# Patient Record
Sex: Female | Born: 1935 | Race: White | Hispanic: No | Marital: Married | Smoking: Former smoker
Health system: Southern US, Community
[De-identification: ages and names within clinical notes are randomized; demographics above are authoritative.]

## PROBLEM LIST (undated history)

## (undated) DIAGNOSIS — E785 Hyperlipidemia, unspecified: Secondary | ICD-10-CM

## (undated) DIAGNOSIS — N183 Chronic kidney disease, stage 3 unspecified: Secondary | ICD-10-CM

## (undated) DIAGNOSIS — M25551 Pain in right hip: Secondary | ICD-10-CM

## (undated) DIAGNOSIS — G8929 Other chronic pain: Secondary | ICD-10-CM

## (undated) DIAGNOSIS — D649 Anemia, unspecified: Secondary | ICD-10-CM

## (undated) DIAGNOSIS — I495 Sick sinus syndrome: Secondary | ICD-10-CM

## (undated) DIAGNOSIS — I10 Essential (primary) hypertension: Secondary | ICD-10-CM

## (undated) DIAGNOSIS — I4819 Other persistent atrial fibrillation: Secondary | ICD-10-CM

## (undated) DIAGNOSIS — M1990 Unspecified osteoarthritis, unspecified site: Secondary | ICD-10-CM

## (undated) HISTORY — DX: Other persistent atrial fibrillation: I48.19

## (undated) HISTORY — PX: APPENDECTOMY: SHX54

## (undated) HISTORY — DX: Essential (primary) hypertension: I10

## (undated) HISTORY — DX: Unspecified osteoarthritis, unspecified site: M19.90

## (undated) HISTORY — DX: Sick sinus syndrome: I49.5

## (undated) HISTORY — DX: Chronic kidney disease, stage 3 (moderate): N18.3

## (undated) HISTORY — DX: Chronic kidney disease, stage 3 unspecified: N18.30

## (undated) HISTORY — PX: HERNIA REPAIR: SHX51

## (undated) HISTORY — PX: CATARACT EXTRACTION W/ INTRAOCULAR LENS  IMPLANT, BILATERAL: SHX1307

## (undated) HISTORY — PX: UMBILICAL HERNIA REPAIR: SHX196

## (undated) HISTORY — DX: Hyperlipidemia, unspecified: E78.5

## (undated) HISTORY — PX: ABDOMINAL HYSTERECTOMY: SHX81

## (undated) HISTORY — PX: TONSILLECTOMY: SUR1361

## (undated) HISTORY — PX: DILATION AND CURETTAGE OF UTERUS: SHX78

---

## 2010-03-16 HISTORY — PX: CARDIOVERSION: SHX1299

## 2012-03-29 HISTORY — PX: PACEMAKER INSERTION: SHX728

## 2013-01-24 DIAGNOSIS — M2040 Other hammer toe(s) (acquired), unspecified foot: Secondary | ICD-10-CM | POA: Insufficient documentation

## 2013-01-24 DIAGNOSIS — M2010 Hallux valgus (acquired), unspecified foot: Secondary | ICD-10-CM | POA: Insufficient documentation

## 2013-01-24 DIAGNOSIS — B351 Tinea unguium: Secondary | ICD-10-CM | POA: Insufficient documentation

## 2013-01-24 DIAGNOSIS — M79673 Pain in unspecified foot: Secondary | ICD-10-CM | POA: Insufficient documentation

## 2013-01-24 DIAGNOSIS — L602 Onychogryphosis: Secondary | ICD-10-CM | POA: Insufficient documentation

## 2013-03-23 ENCOUNTER — Telehealth: Payer: Self-pay | Admitting: Cardiology

## 2013-03-23 NOTE — Telephone Encounter (Signed)
New problem:  Pt's grandson states in the past 5 days the pt has gained about 5 lbs. PT's grandson states he gave 80 mg of lasix on 10/6 and 10/7... Pt states that is a double dose for her. Pt states pt's legs are still swollen. Pt's grandson would like to be advised.

## 2013-03-23 NOTE — Telephone Encounter (Signed)
I would continue with the double dose of Lasix 80 mg for the next several days with close monitoring of salt restriction and fluid restriction to 1.5 L a day. Please keep [place] updated.

## 2013-03-24 ENCOUNTER — Ambulatory Visit (INDEPENDENT_AMBULATORY_CARE_PROVIDER_SITE_OTHER): Payer: Medicare Other | Admitting: Pharmacist

## 2013-03-24 DIAGNOSIS — I4891 Unspecified atrial fibrillation: Secondary | ICD-10-CM

## 2013-03-24 LAB — POCT INR: INR: 2.4

## 2013-03-24 NOTE — Telephone Encounter (Signed)
Spoke with patient and informed to continue the 80 mg  Of Lasix daily and to pay close attention to her salt restriction and fluid restriction to 1.5 L a day. If swelling continues to give [place] a call. Patient verbalized understanding.

## 2013-03-25 ENCOUNTER — Encounter: Payer: Self-pay | Admitting: Cardiology

## 2013-03-25 DIAGNOSIS — E785 Hyperlipidemia, unspecified: Secondary | ICD-10-CM

## 2013-03-25 DIAGNOSIS — I119 Hypertensive heart disease without heart failure: Secondary | ICD-10-CM | POA: Insufficient documentation

## 2013-03-25 DIAGNOSIS — N183 Chronic kidney disease, stage 3 unspecified: Secondary | ICD-10-CM

## 2013-03-25 DIAGNOSIS — Z7901 Long term (current) use of anticoagulants: Secondary | ICD-10-CM

## 2013-03-25 DIAGNOSIS — Z950 Presence of cardiac pacemaker: Secondary | ICD-10-CM

## 2013-03-25 DIAGNOSIS — I10 Essential (primary) hypertension: Secondary | ICD-10-CM

## 2013-03-25 DIAGNOSIS — M1990 Unspecified osteoarthritis, unspecified site: Secondary | ICD-10-CM

## 2013-03-30 ENCOUNTER — Encounter: Payer: Self-pay | Admitting: Cardiology

## 2013-03-30 ENCOUNTER — Ambulatory Visit (INDEPENDENT_AMBULATORY_CARE_PROVIDER_SITE_OTHER): Payer: Medicare Other | Admitting: Cardiology

## 2013-03-30 VITALS — BP 140/91 | HR 91 | Ht 67.0 in | Wt 184.0 lb

## 2013-03-30 DIAGNOSIS — E785 Hyperlipidemia, unspecified: Secondary | ICD-10-CM

## 2013-03-30 DIAGNOSIS — Z950 Presence of cardiac pacemaker: Secondary | ICD-10-CM

## 2013-03-30 DIAGNOSIS — N183 Chronic kidney disease, stage 3 unspecified: Secondary | ICD-10-CM

## 2013-03-30 DIAGNOSIS — Z7901 Long term (current) use of anticoagulants: Secondary | ICD-10-CM

## 2013-03-30 DIAGNOSIS — I4892 Unspecified atrial flutter: Secondary | ICD-10-CM

## 2013-03-30 DIAGNOSIS — I10 Essential (primary) hypertension: Secondary | ICD-10-CM

## 2013-03-30 DIAGNOSIS — Z01812 Encounter for preprocedural laboratory examination: Secondary | ICD-10-CM

## 2013-03-30 DIAGNOSIS — I4891 Unspecified atrial fibrillation: Secondary | ICD-10-CM

## 2013-03-30 LAB — CBC WITH DIFFERENTIAL/PLATELET
Basophils Relative: 0.3 % (ref 0.0–3.0)
Eosinophils Relative: 1.3 % (ref 0.0–5.0)
HCT: 43.4 % (ref 36.0–46.0)
Lymphs Abs: 1.4 10*3/uL (ref 0.7–4.0)
MCV: 91.2 fl (ref 78.0–100.0)
Monocytes Absolute: 0.6 10*3/uL (ref 0.1–1.0)
Platelets: 256 10*3/uL (ref 150.0–400.0)
RBC: 4.75 Mil/uL (ref 3.87–5.11)
RDW: 15.3 % — ABNORMAL HIGH (ref 11.5–14.6)
WBC: 8 10*3/uL (ref 4.5–10.5)

## 2013-03-30 LAB — BASIC METABOLIC PANEL
CO2: 33 mEq/L — ABNORMAL HIGH (ref 19–32)
Calcium: 9.4 mg/dL (ref 8.4–10.5)
Glucose, Bld: 86 mg/dL (ref 70–99)
Potassium: 4.1 mEq/L (ref 3.5–5.1)
Sodium: 142 mEq/L (ref 135–145)

## 2013-03-30 NOTE — Patient Instructions (Signed)
Your physician has recommended that you have a Cardioversion (DCCV). Electrical Cardioversion uses a jolt of electricity to your heart either through paddles or wired patches attached to your chest. This is a controlled, usually prescheduled, procedure. Defibrillation is done under light anesthesia in the hospital, and you usually go home the day of the procedure. This is done to get your heart back into a normal rhythm. You are not awake for the procedure. Please see the instruction sheet given to you today.  Your physician recommends that you continue on your current medications as directed. Please refer to the Current Medication list given to you today.  Your physician recommends that you schedule a follow-up appointment in: 2 weeks after cardioversion that scheduled for 04/04/2013 @ 12:00 am.

## 2013-03-30 NOTE — Progress Notes (Signed)
[address] [place], [place]  [address] Phone: [phone] Fax:  [phone]  Date:  03/30/2013   ID:  Cheryl [name], DOB [birth year], MRN [id number]  PCP:  Johny Blamer, MD   History of Present Illness: Cheryl [name] is a 79 y.o. female with atrial fibrillation/flutter on chronic anticoagulation, pacemaker, hypertension, hyperlipidemia, chronic amiodarone use here for followup.  She has a Medtronic pacemaker implanted in October of 2013. At a previous hospitalization in [place] where her potassium was low, acute renal failure secondary to metolazone and Lasix. She is relatively new to the area 8/14. Overall she is doing very well. Prior myocardial infarction, prior stroke. LDL 91 on 10/21/12. Hemoglobin 12.9. TSH normal. BNP 787 July of 2014. Prior cardioversion October of 2011. EKG previously had shown an ectopic atrial rhythm with rate of 55. Current EKG shows atrial flutter with variable AV block. After prior cardioversion went back into fib.   According to her recent pacemaker check, she has been in chronic atrial fibrillation/flutter since mid June of 2014. Prior to that, she seemed to be in paroxysmal atrial fibrillation. She has been on amiodarone for quite some time. She had a cardioversion after being on amiodarone in [place]. Afternoon nap, sometimes wakes up without warning and startled with gasping. Changes position and this helps. If takes a big breath feels better, goes away.  At times feels left shoulder/chest pain sharp, in chair, non exertional. Dull pain in shoulder and breast which eased up. Hand shakes. Tremor. Better now.   After a long day at church on Sundays she feels quite fatigued and it takes her a while to recover. Her weight had increased slightly. She had doubled up on her Lasix. She has not been doubling up on her potassium however. We will see what her lab work shows.   Wt Readings from Last 3 Encounters:  03/30/13 184  lb (83.462 kg)     Past Medical History  Diagnosis Date  . Atrial fibrillation   . Chronic anticoagulation   . Hyperlipidemia   . Hypertension   . Pacemaker   . CKD (chronic kidney disease), stage III   . Osteoarthritis     Past Surgical History  Procedure Laterality Date  . Tonsillectomy    . Abdominal hysterectomy    . Appendectomy      Current Outpatient Prescriptions  Medication Sig Dispense Refill  . acetaminophen (TYLENOL) 500 MG tablet Take 500 mg by mouth as needed for pain.      Marland Kitchen amiodarone (PACERONE) 200 MG tablet Take 200 mg by mouth 2 (two) times daily.      Marland Kitchen atorvastatin (LIPITOR) 20 MG tablet Take 20 mg by mouth daily.      . Calcium Carb-Cholecalciferol (CALCIUM 500 +D) 500-400 MG-UNIT TABS Take by mouth daily.      . Cholecalciferol 400 UNITS CHEW Chew by mouth daily.      . cyanocobalamin ([employer]S VITAMIN B12) 1000 MCG tablet Take 3,000 mcg by mouth daily.       . fish oil-omega-3 fatty acids 1000 MG capsule Take 1 g by mouth daily.      . furosemide (LASIX) 40 MG tablet Take 40 mg by mouth as needed.       . Magnesium 250 MG TABS Take 250 mg by mouth daily.      . Multiple Vitamins-Minerals (CENTRUM SILVER PO) Take by mouth daily.      . potassium chloride SA (K-DUR,KLOR-CON)  20 MEQ tablet Take 20 mEq by mouth daily.      . vitamin E (VITAMIN E) 400 UNIT capsule Take 400 Units by mouth daily.      Marland Kitchen warfarin (COUMADIN) 2.5 MG tablet Take 2.5 mg by mouth daily. 1 tablet on all days except 1/2 tablet on on fridays once daily       No current facility-administered medications for this visit.    Allergies:    Allergies  Allergen Reactions  . Demerol [Meperidine] Nausea And Vomiting    Social History:  The patient  reports that she quit smoking about 18 years ago. She does not have any smokeless tobacco history on file. She reports that she does not drink alcohol or use illicit drugs.   ROS:  Please see the history of present illness.  Increasing fatigue, no  strokelike symptoms, increasing shortness of breath at times. Increasing weight. No chest pain. No bleeding.   All other systems reviewed and negative.   PHYSICAL EXAM: VS:  BP 140/91  Pulse 91  Ht 5\' 7"  (1.702 m)  Wt 184 lb (83.462 kg)  BMI 28.81 kg/m2 Well nourished, well developed, in no acute distress HEENT: normal Neck: no JVD Cardiac:  Irregularly irregular, normal rate; 2/6 systolic murmur apex Lungs:  clear to auscultation bilaterally, no wheezing, rhonchi or rales Abd: soft, nontender, no hepatomegaly Ext: no edema, compression hose in place Skin: warm and dry Neuro: no focal abnormalities noted  EKG:  Atrial flutter and 91, positive deflection in V1, +3.     ASSESSMENT AND PLAN:  1. Atrial flutter-we discussed at length. We discussed pros and cons of continued rhythm versus rate control. We have decided since she is on amiodarone to once again try an attempt at cardioversion to see if this helps stabilize her rhythm. Close monitoring to symptoms. She may have slightly worse diastolic heart failure with atrial flutter pattern. We have increased her Lasix recently because of mild weight gain. Continue this for now. In the past she has had hypokalemia so we will be checking her lab work prior to cardioversion. If necessary, we will increase her potassium. If cardioversion is not successful or if she reverts back to atrial fibrillation/flutter and is not had any significant change in her symptoms, we will likely discontinue her amiodarone and place her on rate control therapy such as diltiazem or metoprolol. 2. Amiodarone use-continuing to monitor. 3. Chronic anticoagulation-therapeutic INR 2.4 last Thursday. 4. Pacemaker-prior report noted. Atrial flutter since June. 5. Weight gain-likely diastolic dysfunction/mild diastolic heart failure. May be exacerbated by atrial flutter. Continue with Lasix doubled dose as prescribed. Checking potassium. 6. We will see her back 2 weeks post  cardioversion.  Signed, Donato Schultz, MD [facility]  03/30/2013 10:22 AM

## 2013-04-04 ENCOUNTER — Encounter (HOSPITAL_COMMUNITY): Payer: Self-pay | Admitting: *Deleted

## 2013-04-04 ENCOUNTER — Encounter (HOSPITAL_COMMUNITY): Payer: Medicare Other | Admitting: Anesthesiology

## 2013-04-04 ENCOUNTER — Encounter (HOSPITAL_COMMUNITY)
Admission: RE | Disposition: A | Discharge status: Home / Self Care | Payer: Medicare Other | Source: Ambulatory Visit | Attending: Cardiology

## 2013-04-04 ENCOUNTER — Ambulatory Visit (HOSPITAL_COMMUNITY)
Admission: RE | Admit: 2013-04-04 | Discharge: 2013-04-04 | Disposition: A | Discharge status: Home / Self Care | Payer: Medicare Other | Source: Ambulatory Visit | Attending: Cardiology | Admitting: Cardiology

## 2013-04-04 ENCOUNTER — Ambulatory Visit (HOSPITAL_COMMUNITY): Payer: Medicare Other | Admitting: Anesthesiology

## 2013-04-04 DIAGNOSIS — I4892 Unspecified atrial flutter: Secondary | ICD-10-CM

## 2013-04-04 DIAGNOSIS — N183 Chronic kidney disease, stage 3 unspecified: Secondary | ICD-10-CM | POA: Insufficient documentation

## 2013-04-04 DIAGNOSIS — Z7901 Long term (current) use of anticoagulants: Secondary | ICD-10-CM

## 2013-04-04 DIAGNOSIS — E785 Hyperlipidemia, unspecified: Secondary | ICD-10-CM | POA: Insufficient documentation

## 2013-04-04 DIAGNOSIS — Z950 Presence of cardiac pacemaker: Secondary | ICD-10-CM | POA: Insufficient documentation

## 2013-04-04 DIAGNOSIS — I129 Hypertensive chronic kidney disease with stage 1 through stage 4 chronic kidney disease, or unspecified chronic kidney disease: Secondary | ICD-10-CM | POA: Insufficient documentation

## 2013-04-04 DIAGNOSIS — R635 Abnormal weight gain: Secondary | ICD-10-CM | POA: Insufficient documentation

## 2013-04-04 DIAGNOSIS — I4891 Unspecified atrial fibrillation: Secondary | ICD-10-CM | POA: Diagnosis present

## 2013-04-04 HISTORY — PX: CARDIOVERSION: SHX1299

## 2013-04-04 SURGERY — CARDIOVERSION
Anesthesia: General

## 2013-04-04 MED ORDER — PROPOFOL 10 MG/ML IV BOLUS
INTRAVENOUS | Status: DC | PRN
  Administered 2013-04-04: 70 mg via INTRAVENOUS

## 2013-04-04 NOTE — Anesthesia Preprocedure Evaluation (Addendum)
Anesthesia Evaluation  Patient identified by MRN, date of birth, ID band Patient awake    Reviewed: Allergy & Precautions, H&P , NPO status , Patient's Chart, lab work & pertinent test results  History of Anesthesia Complications Negative for: history of anesthetic complications  Airway Mallampati: II TM Distance: >3 FB Neck ROM: Full    Dental  (+) Teeth Intact and Dental Advisory Given   Pulmonary neg pulmonary ROS,    Pulmonary exam normal       Cardiovascular hypertension, + dysrhythmias Atrial Fibrillation Rhythm:Irregular Rate:Normal     Neuro/Psych negative neurological ROS  negative psych ROS   GI/Hepatic negative GI ROS, Neg liver ROS,   Endo/Other  negative endocrine ROS  Renal/GU Renal InsufficiencyRenal disease     Musculoskeletal   Abdominal   Peds  Hematology   Anesthesia Other Findings   Reproductive/Obstetrics                           Anesthesia Physical Anesthesia Plan  ASA: III  Anesthesia Plan: General   Post-op Pain Management:    Induction: Intravenous  Airway Management Planned:   Additional Equipment:   Intra-op Plan:   Post-operative Plan:   Informed Consent: I have reviewed the patients History and Physical, chart, labs and discussed the procedure including the risks, benefits and alternatives for the proposed anesthesia with the patient or authorized representative who has indicated his/her understanding and acceptance.   Dental advisory given  Plan Discussed with: CRNA, Anesthesiologist and Surgeon  Anesthesia Plan Comments:         Anesthesia Quick Evaluation

## 2013-04-04 NOTE — Anesthesia Postprocedure Evaluation (Signed)
Anesthesia Post Note  Patient: Cheryl [name]  Procedure(s) Performed: Procedure(s) (LRB): CARDIOVERSION (N/A)  Anesthesia type: general  Patient location: PACU  Post pain: Pain level controlled  Post assessment: Patient's Cardiovascular Status Stable  Last Vitals:  Filed Vitals:   04/04/13 1310  BP: 152/58  Pulse: 62  Temp:   Resp: 17    Post vital signs: Reviewed and stable  Level of consciousness: sedated  Complications: No apparent anesthesia complications

## 2013-04-04 NOTE — Interval H&P Note (Signed)
History and Physical Interval Note:  04/04/2013 12:40 PM  Cheryl [name]  has presented today for surgery, with the diagnosis of aflutter  The various methods of treatment have been discussed with the patient and family. After consideration of risks, benefits and other options for treatment, the patient has consented to  Procedure(s): CARDIOVERSION (N/A) as a surgical intervention .  The patient's history has been reviewed, patient examined, no change in status, stable for surgery.  I have reviewed the patient's chart and labs.  Questions were answered to the patient's satisfaction.     SKAINS, MARK

## 2013-04-04 NOTE — Transfer of Care (Signed)
Immediate Anesthesia Transfer of Care Note  Patient: Cheryl [name]  Procedure(s) Performed: Procedure(s): CARDIOVERSION (N/A)  Patient Location: PACU  Anesthesia Type:General  Level of Consciousness: awake, alert , oriented and sedated  Airway & Oxygen Therapy: Patient Spontanous Breathing and Patient connected to face mask oxygen  Post-op Assessment: Report given to PACU RN, Post -op Vital signs reviewed and stable and Patient moving all extremities  Post vital signs: Reviewed and stable  Complications: No apparent anesthesia complications

## 2013-04-04 NOTE — CV Procedure (Signed)
Electrical Cardioversion Procedure Note DESHUNDA THACKSTON [id number] [birth year]  Procedure: Electrical Cardioversion Indications:  Atrial Fibrillation  Time Out: Verified patient identification, verified procedure,medications/allergies/relevent history reviewed, required imaging and test results available.  Performed  Procedure Details  The patient was NPO after midnight. Anesthesia was administered at the beside  by Dr.Singer with 70mg  of propofol.  Cardioversion was performed with synchronized biphasic defibrillation via AP pads with 150, 200, 200 joules.  3 attempt(s) were performed.  The patient converted to normal sinus rhythm. The patient tolerated the procedure well   IMPRESSION:  Successful cardioversion of atrial fibrillation.     Kalyani Maeda 04/04/2013, 12:52 PM

## 2013-04-04 NOTE — H&P (View-Only) (Signed)
    [address] [place], [place]  [address] Phone: [phone] Fax:  [phone]  Date:  03/30/2013   ID:  Cheryl [name], DOB 01/29/1927, MRN [id number]  PCP:  HARRIS, WILLIAM, MD   History of Present Illness: Cheryl [name] is a 79 y.o. female with atrial fibrillation/flutter on chronic anticoagulation, pacemaker, hypertension, hyperlipidemia, chronic amiodarone use here for followup.  She has a Medtronic pacemaker implanted in October of 2013. At a previous hospitalization in [place] where her potassium was low, acute renal failure secondary to metolazone and Lasix. She is relatively new to the area 8/14. Overall she is doing very well. Prior myocardial infarction, prior stroke. LDL 91 on 10/21/12. Hemoglobin 12.9. TSH normal. BNP 787 July of 2014. Prior cardioversion October of 2011. EKG previously had shown an ectopic atrial rhythm with rate of 55. Current EKG shows atrial flutter with variable AV block. After prior cardioversion went back into fib.   According to her recent pacemaker check, she has been in chronic atrial fibrillation/flutter since mid June of 2014. Prior to that, she seemed to be in paroxysmal atrial fibrillation. She has been on amiodarone for quite some time. She had a cardioversion after being on amiodarone in [place]. Afternoon nap, sometimes wakes up without warning and startled with gasping. Changes position and this helps. If takes a big breath feels better, goes away.  At times feels left shoulder/chest pain sharp, in chair, non exertional. Dull pain in shoulder and breast which eased up. Hand shakes. Tremor. Better now.   After a long day at church on Sundays she feels quite fatigued and it takes her a while to recover. Her weight had increased slightly. She had doubled up on her Lasix. She has not been doubling up on her potassium however. We will see what her lab work shows.   Wt Readings from Last 3 Encounters:  03/30/13 184  lb (83.462 kg)     Past Medical History  Diagnosis Date  . Atrial fibrillation   . Chronic anticoagulation   . Hyperlipidemia   . Hypertension   . Pacemaker   . CKD (chronic kidney disease), stage III   . Osteoarthritis     Past Surgical History  Procedure Laterality Date  . Tonsillectomy    . Abdominal hysterectomy    . Appendectomy      Current Outpatient Prescriptions  Medication Sig Dispense Refill  . acetaminophen (TYLENOL) 500 MG tablet Take 500 mg by mouth as needed for pain.      . amiodarone (PACERONE) 200 MG tablet Take 200 mg by mouth 2 (two) times daily.      . atorvastatin (LIPITOR) 20 MG tablet Take 20 mg by mouth daily.      . Calcium Carb-Cholecalciferol (CALCIUM 500 +D) 500-400 MG-UNIT TABS Take by mouth daily.      . Cholecalciferol 400 UNITS CHEW Chew by mouth daily.      . cyanocobalamin (CVS VITAMIN B12) 1000 MCG tablet Take 3,000 mcg by mouth daily.       . fish oil-omega-3 fatty acids 1000 MG capsule Take 1 g by mouth daily.      . furosemide (LASIX) 40 MG tablet Take 40 mg by mouth as needed.       . Magnesium 250 MG TABS Take 250 mg by mouth daily.      . Multiple Vitamins-Minerals (CENTRUM SILVER PO) Take by mouth daily.      . potassium chloride SA (K-DUR,KLOR-CON)   20 MEQ tablet Take 20 mEq by mouth daily.      . vitamin E (VITAMIN E) 400 UNIT capsule Take 400 Units by mouth daily.      . warfarin (COUMADIN) 2.5 MG tablet Take 2.5 mg by mouth daily. 1 tablet on all days except 1/2 tablet on on fridays once daily       No current facility-administered medications for this visit.    Allergies:    Allergies  Allergen Reactions  . Demerol [Meperidine] Nausea And Vomiting    Social History:  The patient  reports that she quit smoking about 18 years ago. She does not have any smokeless tobacco history on file. She reports that she does not drink alcohol or use illicit drugs.   ROS:  Please see the history of present illness.  Increasing fatigue, no  strokelike symptoms, increasing shortness of breath at times. Increasing weight. No chest pain. No bleeding.   All other systems reviewed and negative.   PHYSICAL EXAM: VS:  BP 140/91  Pulse 91  Ht 5' 7" (1.702 m)  Wt 184 lb (83.462 kg)  BMI 28.81 kg/m2 Well nourished, well developed, in no acute distress HEENT: normal Neck: no JVD Cardiac:  Irregularly irregular, normal rate; 2/6 systolic murmur apex Lungs:  clear to auscultation bilaterally, no wheezing, rhonchi or rales Abd: soft, nontender, no hepatomegaly Ext: no edema, compression hose in place Skin: warm and dry Neuro: no focal abnormalities noted  EKG:  Atrial flutter and 91, positive deflection in V1, +3.     ASSESSMENT AND PLAN:  1. Atrial flutter-we discussed at length. We discussed pros and cons of continued rhythm versus rate control. We have decided since she is on amiodarone to once again try an attempt at cardioversion to see if this helps stabilize her rhythm. Close monitoring to symptoms. She may have slightly worse diastolic heart failure with atrial flutter pattern. We have increased her Lasix recently because of mild weight gain. Continue this for now. In the past she has had hypokalemia so we will be checking her lab work prior to cardioversion. If necessary, we will increase her potassium. If cardioversion is not successful or if she reverts back to atrial fibrillation/flutter and is not had any significant change in her symptoms, we will likely discontinue her amiodarone and place her on rate control therapy such as diltiazem or metoprolol. 2. Amiodarone use-continuing to monitor. 3. Chronic anticoagulation-therapeutic INR 2.4 last Thursday. 4. Pacemaker-prior report noted. Atrial flutter since June. 5. Weight gain-likely diastolic dysfunction/mild diastolic heart failure. May be exacerbated by atrial flutter. Continue with Lasix doubled dose as prescribed. Checking potassium. 6. We will see her back 2 weeks post  cardioversion.  Signed, Avis Tirone, MD FACC  03/30/2013 10:22 AM    

## 2013-04-05 ENCOUNTER — Encounter (HOSPITAL_COMMUNITY): Payer: Self-pay | Admitting: Cardiology

## 2013-04-06 ENCOUNTER — Telehealth: Payer: Self-pay | Admitting: Cardiology

## 2013-04-06 NOTE — Telephone Encounter (Signed)
New Problem    Please call gran daughter Cheryl [name] she has some observations she would like to speak to you about.  Pt is having SOB and Dizziness.

## 2013-04-07 NOTE — Telephone Encounter (Signed)
Spoke with patient grandson, he advised that she is no longer having the symptoms, told him to monitor her, should she have symptoms again to call to get her in for appointment, or if its unbearable to go to the ER. HE did state occassional chest discomfort, recent cardioversion ,explained that normal for about a 1-2 weeks.

## 2013-04-17 ENCOUNTER — Emergency Department (HOSPITAL_BASED_OUTPATIENT_CLINIC_OR_DEPARTMENT_OTHER): Payer: Medicare Other

## 2013-04-17 ENCOUNTER — Emergency Department (HOSPITAL_BASED_OUTPATIENT_CLINIC_OR_DEPARTMENT_OTHER)
Admission: EM | Admit: 2013-04-17 | Discharge: 2013-04-17 | Disposition: A | Discharge status: Home / Self Care | Payer: Medicare Other | Attending: Emergency Medicine | Admitting: Emergency Medicine

## 2013-04-17 ENCOUNTER — Encounter (HOSPITAL_BASED_OUTPATIENT_CLINIC_OR_DEPARTMENT_OTHER): Payer: Self-pay | Admitting: Emergency Medicine

## 2013-04-17 DIAGNOSIS — Z79899 Other long term (current) drug therapy: Secondary | ICD-10-CM | POA: Insufficient documentation

## 2013-04-17 DIAGNOSIS — X500XXA Overexertion from strenuous movement or load, initial encounter: Secondary | ICD-10-CM | POA: Insufficient documentation

## 2013-04-17 DIAGNOSIS — Z950 Presence of cardiac pacemaker: Secondary | ICD-10-CM | POA: Insufficient documentation

## 2013-04-17 DIAGNOSIS — R059 Cough, unspecified: Secondary | ICD-10-CM | POA: Insufficient documentation

## 2013-04-17 DIAGNOSIS — Z87891 Personal history of nicotine dependence: Secondary | ICD-10-CM | POA: Insufficient documentation

## 2013-04-17 DIAGNOSIS — N183 Chronic kidney disease, stage 3 unspecified: Secondary | ICD-10-CM | POA: Insufficient documentation

## 2013-04-17 DIAGNOSIS — M1990 Unspecified osteoarthritis, unspecified site: Secondary | ICD-10-CM | POA: Insufficient documentation

## 2013-04-17 DIAGNOSIS — I4891 Unspecified atrial fibrillation: Secondary | ICD-10-CM | POA: Insufficient documentation

## 2013-04-17 DIAGNOSIS — R05 Cough: Secondary | ICD-10-CM | POA: Insufficient documentation

## 2013-04-17 DIAGNOSIS — Z7901 Long term (current) use of anticoagulants: Secondary | ICD-10-CM | POA: Insufficient documentation

## 2013-04-17 DIAGNOSIS — S93402A Sprain of unspecified ligament of left ankle, initial encounter: Secondary | ICD-10-CM

## 2013-04-17 DIAGNOSIS — S8000XA Contusion of unspecified knee, initial encounter: Secondary | ICD-10-CM | POA: Insufficient documentation

## 2013-04-17 DIAGNOSIS — R5381 Other malaise: Secondary | ICD-10-CM | POA: Insufficient documentation

## 2013-04-17 DIAGNOSIS — S93409A Sprain of unspecified ligament of unspecified ankle, initial encounter: Secondary | ICD-10-CM | POA: Insufficient documentation

## 2013-04-17 DIAGNOSIS — W010XXA Fall on same level from slipping, tripping and stumbling without subsequent striking against object, initial encounter: Secondary | ICD-10-CM | POA: Insufficient documentation

## 2013-04-17 DIAGNOSIS — E785 Hyperlipidemia, unspecified: Secondary | ICD-10-CM | POA: Insufficient documentation

## 2013-04-17 DIAGNOSIS — Y9389 Activity, other specified: Secondary | ICD-10-CM | POA: Insufficient documentation

## 2013-04-17 DIAGNOSIS — S8002XA Contusion of left knee, initial encounter: Secondary | ICD-10-CM

## 2013-04-17 DIAGNOSIS — Y929 Unspecified place or not applicable: Secondary | ICD-10-CM | POA: Insufficient documentation

## 2013-04-17 DIAGNOSIS — I129 Hypertensive chronic kidney disease with stage 1 through stage 4 chronic kidney disease, or unspecified chronic kidney disease: Secondary | ICD-10-CM | POA: Insufficient documentation

## 2013-04-17 LAB — URINALYSIS, ROUTINE W REFLEX MICROSCOPIC
Bilirubin Urine: NEGATIVE
Glucose, UA: NEGATIVE mg/dL
Hgb urine dipstick: NEGATIVE
Ketones, ur: NEGATIVE mg/dL
Nitrite: NEGATIVE
pH: 5.5 (ref 5.0–8.0)

## 2013-04-17 LAB — COMPREHENSIVE METABOLIC PANEL
BUN: 25 mg/dL — ABNORMAL HIGH (ref 6–23)
CO2: 28 mEq/L (ref 19–32)
Calcium: 9.9 mg/dL (ref 8.4–10.5)
Chloride: 102 mEq/L (ref 96–112)
Creatinine, Ser: 1.4 mg/dL — ABNORMAL HIGH (ref 0.50–1.10)
GFR calc non Af Amer: 33 mL/min — ABNORMAL LOW (ref >90)
Sodium: 140 mEq/L (ref 135–145)
Total Bilirubin: 0.8 mg/dL (ref 0.3–1.2)

## 2013-04-17 LAB — CBC WITH DIFFERENTIAL/PLATELET
Basophils Relative: 0 % (ref 0–1)
Eosinophils Absolute: 0 10*3/uL (ref 0.0–0.7)
Eosinophils Relative: 0 % (ref 0–5)
HCT: 40.5 % (ref 36.0–46.0)
Hemoglobin: 13.4 g/dL (ref 12.0–15.0)
MCH: 30.2 pg (ref 26.0–34.0)
MCHC: 33.1 g/dL (ref 30.0–36.0)
MCV: 91.4 fL (ref 78.0–100.0)
Monocytes Absolute: 1 10*3/uL (ref 0.1–1.0)
Monocytes Relative: 6 % (ref 3–12)
Neutro Abs: 13.4 10*3/uL — ABNORMAL HIGH (ref 1.7–7.7)
Platelets: 281 10*3/uL (ref 150–400)

## 2013-04-17 LAB — URINE MICROSCOPIC-ADD ON

## 2013-04-17 LAB — PROTIME-INR
INR: 2.28 — ABNORMAL HIGH (ref 0.00–1.49)
Prothrombin Time: 24.4 seconds — ABNORMAL HIGH (ref 11.6–15.2)

## 2013-04-17 NOTE — ED Provider Notes (Signed)
CSN: [id number]     Arrival date & time 04/17/13  2006 History   First MD Initiated Contact with Patient 04/17/13 2038     Chief Complaint  Patient presents with  . Ankle Pain   (Consider location/radiation/quality/duration/timing/severity/associated sxs/prior Treatment) Patient is a 79 y.o. female presenting with weakness. The history is provided by the patient and a relative. No language interpreter was used.  Weakness This is a new problem. The current episode started today. The problem occurs constantly. The problem has been unchanged. Associated symptoms include coughing, joint swelling, myalgias and weakness. Nothing aggravates the symptoms. She has tried nothing for the symptoms. The treatment provided moderate relief.  Pt had trouble getting off the toliet today.   Pt fell on the floor and turned her ankle.    Past Medical History  Diagnosis Date  . Atrial fibrillation     Prior cardioversion 10/11  . Chronic anticoagulation   . Hyperlipidemia   . Hypertension   . Pacemaker     Medtronic-10/13 implantation  . CKD (chronic kidney disease), stage III   . Osteoarthritis    Past Surgical History  Procedure Laterality Date  . Tonsillectomy    . Abdominal hysterectomy    . Appendectomy    . Cardioversion N/A 04/04/2013    Procedure: CARDIOVERSION;  Surgeon: Donato Schultz, MD;  Location: [facility] ENDOSCOPY;  Service: Cardiovascular;  Laterality: N/A;   Family History  Problem Relation Age of Onset  . Stroke Mother   . Heart Problems Father   . Hypertension      family history  . Diabetes      family history   History  Substance Use Topics  . Smoking status: Former Smoker    Quit date: 06/15/1994  . Smokeless tobacco: Not on file  . Alcohol Use: No   OB History   Grav Para Term Preterm Abortions TAB SAB Ect Mult Living                 Review of Systems  Respiratory: Positive for cough.   Musculoskeletal: Positive for joint swelling and myalgias.  Neurological:  Positive for weakness.  All other systems reviewed and are negative.    Allergies  Demerol  Home Medications   Current Outpatient Rx  Name  Route  Sig  Dispense  Refill  . acetaminophen (TYLENOL) 500 MG tablet   Oral   Take 500 mg by mouth as needed for pain.         Marland Kitchen amiodarone (PACERONE) 200 MG tablet   Oral   Take 200 mg by mouth 2 (two) times daily.         Marland Kitchen atorvastatin (LIPITOR) 20 MG tablet   Oral   Take 20 mg by mouth daily.         . Calcium Carb-Cholecalciferol (CALCIUM 500 +D) 500-400 MG-UNIT TABS   Oral   Take by mouth daily.         . Cholecalciferol 400 UNITS CHEW   Oral   Chew by mouth daily.         . cyanocobalamin (CVS VITAMIN B12) 1000 MCG tablet   Oral   Take 3,000 mcg by mouth daily.          . fish oil-omega-3 fatty acids 1000 MG capsule   Oral   Take 1 g by mouth daily.         . furosemide (LASIX) 40 MG tablet   Oral   Take 40 mg by mouth as  needed.          . Magnesium 250 MG TABS   Oral   Take 250 mg by mouth daily.         . Multiple Vitamins-Minerals (CENTRUM SILVER PO)   Oral   Take by mouth daily.         . potassium chloride SA (K-DUR,KLOR-CON) 20 MEQ tablet   Oral   Take 20 mEq by mouth daily.         . vitamin E (VITAMIN E) 400 UNIT capsule   Oral   Take 400 Units by mouth daily.         Marland Kitchen warfarin (COUMADIN) 2.5 MG tablet   Oral   Take 2.5 mg by mouth daily. 1 tablet on all days except 1/2 tablet on on fridays once daily          BP 159/60  Pulse 71  Temp(Src) 99.1 F (37.3 C) (Oral)  Resp 16  SpO2 95% Physical Exam  Nursing note and vitals reviewed. Constitutional: She is oriented to person, place, and time. She appears well-developed and well-nourished.  HENT:  Head: Normocephalic.  Right Ear: External ear normal.  Left Ear: External ear normal.  Nose: Nose normal.  Mouth/Throat: Oropharynx is clear and moist.  Eyes: Conjunctivae and EOM are normal. Pupils are equal, round,  and reactive to light.  Neck: Normal range of motion. Neck supple.  Cardiovascular: Normal rate and regular rhythm.   Pulmonary/Chest: Effort normal and breath sounds normal.  Abdominal: Soft.  Musculoskeletal: She exhibits edema and tenderness.  Neurological: She is alert and oriented to person, place, and time.  Skin: Skin is warm.  Psychiatric: She has a normal mood and affect.    ED Course  Procedures (including critical care time) Labs Review Labs Reviewed - No data to display Imaging Review Dg Chest 2 View  04/17/2013   CLINICAL DATA:  Recent traumatic injury with pain  EXAM: CHEST  2 VIEW  COMPARISON:  None.  FINDINGS: The cardiac shadow is mildly enlarged. A pacing device is seen. There is a rounded density identified in the mid to lower right lung. This may simply represent a nipple shadow. No focal infiltrate is seen.  IMPRESSION: Questionable nodular density on the right. Repeat imaging with nipple markers is recommended to rule out an underlying nodule.   Electronically Signed   By: Alcide Clever M.D.   On: 04/17/2013 21:09   Dg Ankle Complete Left  04/17/2013   CLINICAL DATA:  Traumatic injury with pain  EXAM: LEFT ANKLE COMPLETE - 3+ VIEW  COMPARISON:  None.  FINDINGS: No acute fractures noted. Degenerative changes in the tarsal bones are noted.  IMPRESSION: No acute abnormality seen.   Electronically Signed   By: Alcide Clever M.D.   On: 04/17/2013 21:08   Dg Knee Complete 4 Views Left  04/17/2013   CLINICAL DATA:  Knee pain following injury  EXAM: LEFT KNEE - COMPLETE 4+ VIEW  COMPARISON:  None.  FINDINGS: There is no evidence of fracture, dislocation, or joint effusion. There is no evidence of arthropathy or other focal bone abnormality. Soft tissues show diffuse calcification in the popliteal artery. .  IMPRESSION: No acute abnormality noted.   Electronically Signed   By: Alcide Clever M.D.   On: 04/17/2013 21:09    EKG Interpretation     Ventricular Rate:  72 PR  Interval:  248 QRS Duration: 78 QT Interval:  416 QTC Calculation: 455 R Axis:  33 Text Interpretation:  Sinus rhythm with 1st degree A-V block Septal infarct , age undetermined Abnormal ECG PR interval shorter compared to prior ekg of  04/04/13           Results for orders placed during the hospital encounter of 04/17/13  URINALYSIS, ROUTINE W REFLEX MICROSCOPIC      Result Value Range   Color, Urine YELLOW  YELLOW   APPearance CLEAR  CLEAR   Specific Gravity, Urine 1.020  1.005 - 1.030   pH 5.5  5.0 - 8.0   Glucose, UA NEGATIVE  NEGATIVE mg/dL   Hgb urine dipstick NEGATIVE  NEGATIVE   Bilirubin Urine NEGATIVE  NEGATIVE   Ketones, ur NEGATIVE  NEGATIVE mg/dL   Protein, ur 30 (*) NEGATIVE mg/dL   Urobilinogen, UA 0.2  0.0 - 1.0 mg/dL   Nitrite NEGATIVE  NEGATIVE   Leukocytes, UA SMALL (*) NEGATIVE  CBC WITH DIFFERENTIAL      Result Value Range   WBC 15.3 (*) 4.0 - 10.5 K/uL   RBC 4.43  3.87 - 5.11 MIL/uL   Hemoglobin 13.4  12.0 - 15.0 g/dL   HCT 40.9  81.1 - 91.4 %   MCV 91.4  78.0 - 100.0 fL   MCH 30.2  26.0 - 34.0 pg   MCHC 33.1  30.0 - 36.0 g/dL   RDW 78.2  95.6 - 21.3 %   Platelets 281  150 - 400 K/uL   Neutrophils Relative % 88 (*) 43 - 77 %   Neutro Abs 13.4 (*) 1.7 - 7.7 K/uL   Lymphocytes Relative 6 (*) 12 - 46 %   Lymphs Abs 0.8  0.7 - 4.0 K/uL   Monocytes Relative 6  3 - 12 %   Monocytes Absolute 1.0  0.1 - 1.0 K/uL   Eosinophils Relative 0  0 - 5 %   Eosinophils Absolute 0.0  0.0 - 0.7 K/uL   Basophils Relative 0  0 - 1 %   Basophils Absolute 0.0  0.0 - 0.1 K/uL  COMPREHENSIVE METABOLIC PANEL      Result Value Range   Sodium 140  135 - 145 mEq/L   Potassium 4.0  3.5 - 5.1 mEq/L   Chloride 102  96 - 112 mEq/L   CO2 28  19 - 32 mEq/L   Glucose, Bld 118 (*) 70 - 99 mg/dL   BUN 25 (*) 6 - 23 mg/dL   Creatinine, Ser 0.86 (*) 0.50 - 1.10 mg/dL   Calcium 9.9  8.4 - 57.8 mg/dL   Total Protein 7.8  6.0 - 8.3 g/dL   Albumin 3.3 (*) 3.5 - 5.2 g/dL   AST  26  0 - 37 U/L   ALT 19  0 - 35 U/L   Alkaline Phosphatase 121 (*) 39 - 117 U/L   Total Bilirubin 0.8  0.3 - 1.2 mg/dL   GFR calc non Af Amer 33 (*) >90 mL/min   GFR calc Af Amer 39 (*) >90 mL/min  PROTIME-INR      Result Value Range   Prothrombin Time 24.4 (*) 11.6 - 15.2 seconds   INR 2.28 (*) 0.00 - 1.49  URINE MICROSCOPIC-ADD ON      Result Value Range   Squamous Epithelial / LPF FEW (*) RARE   WBC, UA 3-6  <3 WBC/hpf   RBC / HPF 0-2  <3 RBC/hpf   Bacteria, UA FEW (*) RARE   Casts HYALINE CASTS (*) NEGATIVE   Dg Chest 2 View  04/17/2013   CLINICAL DATA:  Ankle pain. Followup evaluation for possible nodular opacity. Nipple markers in place on this examination.  EXAM: CHEST  2 VIEW  COMPARISON:  CHEST x-ray 04/17/2013.  FINDINGS: There is cephalization of the pulmonary vasculature and slight indistinctness of the interstitial markings suggestive of mild pulmonary edema. Mild cardiomegaly No definite consolidative airspace disease. Previously suspected nodular opacity in the right mid lung corresponds to prominent 5th costochondral junction anteriorly. No definite nodular opacity identified on today's study. Trace right pleural effusion. Upper mediastinal contours are within normal limits. Atherosclerosis in the thoracic aorta. Left-sided pacemaker device in place with lead tips projecting over the expected location of the right atrium and right ventricular apex.  IMPRESSION: 1. No suspicious pulmonary nodule identified. 2. The appearance the chest suggests mild congestive heart failure, as above. 3. Atherosclerosis.   Electronically Signed   By: Trudie Reed M.D.   On: 04/17/2013 21:50   Dg Chest 2 View  04/17/2013   CLINICAL DATA:  Recent traumatic injury with pain  EXAM: CHEST  2 VIEW  COMPARISON:  None.  FINDINGS: The cardiac shadow is mildly enlarged. A pacing device is seen. There is a rounded density identified in the mid to lower right lung. This may simply represent a nipple  shadow. No focal infiltrate is seen.  IMPRESSION: Questionable nodular density on the right. Repeat imaging with nipple markers is recommended to rule out an underlying nodule.   Electronically Signed   By: Alcide Clever M.D.   On: 04/17/2013 21:09   Dg Ankle Complete Left  04/17/2013   CLINICAL DATA:  Traumatic injury with pain  EXAM: LEFT ANKLE COMPLETE - 3+ VIEW  COMPARISON:  None.  FINDINGS: No acute fractures noted. Degenerative changes in the tarsal bones are noted.  IMPRESSION: No acute abnormality seen.   Electronically Signed   By: Alcide Clever M.D.   On: 04/17/2013 21:08   Dg Knee Complete 4 Views Left  04/17/2013   CLINICAL DATA:  Knee pain following injury  EXAM: LEFT KNEE - COMPLETE 4+ VIEW  COMPARISON:  None.  FINDINGS: There is no evidence of fracture, dislocation, or joint effusion. There is no evidence of arthropathy or other focal bone abnormality. Soft tissues show diffuse calcification in the popliteal artery. .  IMPRESSION: No acute abnormality noted.   Electronically Signed   By: Alcide Clever M.D.   On: 04/17/2013 21:09   Dr. Karma Ganja in to see and examine pt.    Family (RN Vincenza Hews)   Advised monitor pt's temp.   We suspect elevated wbc's due to viral uri  MDM   1. Ankle sprain, left, initial encounter   2. Contusion of left knee, initial encounter        Elson Areas, PA-C 04/17/13 2342

## 2013-04-17 NOTE — ED Provider Notes (Signed)
Medical screening examination/treatment/procedure(s) were conducted as a shared visit with non-physician practitioner(s) and myself.  I personally evaluated the patient during the encounter.  EKG Interpretation     Ventricular Rate:  72 PR Interval:  248 QRS Duration: 78 QT Interval:  416 QTC Calculation: 455 R Axis:   33 Text Interpretation:  Sinus rhythm with 1st degree A-V block Septal infarct , age undetermined Abnormal ECG PR interval shorter compared to prior ekg of  04/04/13           PT seen and evaluated.  Pt is overall nontoxic and well hydrated in appearance.  Discussed all results with patient and grandson at bedside.  She has had multiple family members with viral URI and she has similar symptoms.  CXR and ua reassuring- urine culture pending.  Pt advised to have WBC rechecked in the next 2-3 days with her PMD and to monitor fever and respiratory status.  Discharged with strict return precautions.  Pt agreeable with plan.  Ethelda Chick, MD 04/17/13 [phone]

## 2013-04-17 NOTE — ED Notes (Addendum)
Pt was trying to sit in chair and slipped and fell on floor, twisting left ankle, c/o cold and congestions for several, denies SOB, no pain in ankle when sitting, cannot walk, swelling noted to ankle

## 2013-04-18 ENCOUNTER — Telehealth: Payer: Self-pay | Admitting: Cardiology

## 2013-04-18 ENCOUNTER — Ambulatory Visit: Payer: PRIVATE HEALTH INSURANCE | Admitting: Cardiology

## 2013-04-18 NOTE — Telephone Encounter (Signed)
New problem     Pt's grand daughter called pt fell and was seen at [facility].   She has a question about pt med. AMIODRONE. Should they renew this.

## 2013-04-19 LAB — URINE CULTURE: Colony Count: 25000

## 2013-04-22 ENCOUNTER — Telehealth: Payer: Self-pay | Admitting: *Deleted

## 2013-04-22 NOTE — Telephone Encounter (Signed)
Patient stated that her Furosemide was changed to bid and she needs a new rx sent to [facility] on [address].

## 2013-04-25 MED ORDER — FUROSEMIDE 40 MG PO TABS: 40.0000 mg | ORAL_TABLET | Freq: Two times a day (BID) | ORAL | Status: DC

## 2013-04-25 NOTE — Telephone Encounter (Signed)
Spoke with patients grandson and he advised that the patient was taking  40 mg QD and an additional tablet as needed. Per last visit patient stated that Dr. Anne Fu advised to take 40 mg twice a day. New Rx sent to pharmacy.Marland KitchenMarland Kitchen

## 2013-05-10 ENCOUNTER — Ambulatory Visit (INDEPENDENT_AMBULATORY_CARE_PROVIDER_SITE_OTHER): Payer: Medicare Other | Admitting: General Practice

## 2013-05-10 DIAGNOSIS — I4891 Unspecified atrial fibrillation: Secondary | ICD-10-CM

## 2013-05-23 ENCOUNTER — Ambulatory Visit (INDEPENDENT_AMBULATORY_CARE_PROVIDER_SITE_OTHER): Payer: Medicare Other | Admitting: *Deleted

## 2013-05-23 DIAGNOSIS — I4892 Unspecified atrial flutter: Secondary | ICD-10-CM

## 2013-05-23 DIAGNOSIS — I4891 Unspecified atrial fibrillation: Secondary | ICD-10-CM

## 2013-06-02 ENCOUNTER — Ambulatory Visit (INDEPENDENT_AMBULATORY_CARE_PROVIDER_SITE_OTHER): Payer: Medicare Other | Admitting: Cardiology

## 2013-06-02 ENCOUNTER — Encounter: Payer: Self-pay | Admitting: Cardiology

## 2013-06-02 ENCOUNTER — Ambulatory Visit: Payer: Self-pay | Admitting: Cardiology

## 2013-06-02 ENCOUNTER — Ambulatory Visit (INDEPENDENT_AMBULATORY_CARE_PROVIDER_SITE_OTHER): Payer: Medicare Other | Admitting: Pharmacist

## 2013-06-02 VITALS — BP 140/70 | HR 67 | Ht 67.0 in | Wt 176.0 lb

## 2013-06-02 DIAGNOSIS — I4891 Unspecified atrial fibrillation: Secondary | ICD-10-CM

## 2013-06-02 DIAGNOSIS — I10 Essential (primary) hypertension: Secondary | ICD-10-CM

## 2013-06-02 DIAGNOSIS — Z7901 Long term (current) use of anticoagulants: Secondary | ICD-10-CM

## 2013-06-02 DIAGNOSIS — Z950 Presence of cardiac pacemaker: Secondary | ICD-10-CM

## 2013-06-02 DIAGNOSIS — E785 Hyperlipidemia, unspecified: Secondary | ICD-10-CM

## 2013-06-02 MED ORDER — AMIODARONE HCL 200 MG PO TABS: 200.0000 mg | ORAL_TABLET | Freq: Every day | ORAL | Status: DC

## 2013-06-02 NOTE — Patient Instructions (Signed)
Your physician wants you to follow-up in: 6 MONTHS with DR. Skains You will receive a reminder letter in the mail two months in advance. If you don't receive a letter, please call our office to schedule the follow-up appointment.  Your physician has recommended you make the following change in your medication:  1. Decrease Amiodarone to 200 mg once daily

## 2013-06-02 NOTE — Progress Notes (Signed)
[address] [place], [place]  [address] Phone: [phone] Fax:  [phone]  Date:  06/02/2013   ID:  Cheryl [name], DOB 1926/07/09, MRN [id number]  PCP:  Johny Blamer, MD   History of Present Illness: Cheryl [name] is a 79 y.o. female with atrial fibrillation/flutter on chronic anticoagulation, pacemaker, hypertension, hyperlipidemia, chronic amiodarone use here for followup.  She has a Medtronic pacemaker implanted in October of 2013. At a previous hospitalization in [place] where her potassium was low, acute renal failure secondary to metolazone and Lasix. She is relatively new to the area 8/14. Overall she is doing very well. Prior myocardial infarction, prior stroke. LDL 91 on 10/21/12. Hemoglobin 12.9. TSH normal. BNP 787 July of 2014. Prior cardioversion October of 2011. EKG previously had shown an ectopic atrial rhythm with rate of 55. Current EKG shows atrial flutter with variable AV block. After prior cardioversion went back into fib.   According to her recent pacemaker check, she has been in chronic atrial fibrillation/flutter since mid June of 2014. Prior to that, she seemed to be in paroxysmal atrial fibrillation. She has been on amiodarone for quite some time. She had a cardioversion after being on amiodarone in [place]. Afternoon nap, sometimes wakes up without warning and startled with gasping. Changes position and this helps. If takes a big breath feels better, goes away.  Unfortunately she had a fall. Right knee effusion. Overall feels well without any palpitations.  Wt Readings from Last 3 Encounters:  06/02/13 176 lb (79.833 kg)  03/30/13 184 lb (83.462 kg)     Past Medical History  Diagnosis Date  . Atrial fibrillation     Prior cardioversion 10/11  . Chronic anticoagulation   . Hyperlipidemia   . Hypertension   . Pacemaker     Medtronic-10/13 implantation  . CKD (chronic kidney disease), stage III   . Osteoarthritis       Past Surgical History  Procedure Laterality Date  . Tonsillectomy    . Abdominal hysterectomy    . Appendectomy    . Cardioversion N/A 04/04/2013    Procedure: CARDIOVERSION;  Surgeon: Donato Schultz, MD;  Location: [facility] ENDOSCOPY;  Service: Cardiovascular;  Laterality: N/A;    Current Outpatient Prescriptions  Medication Sig Dispense Refill  . acetaminophen (TYLENOL) 500 MG tablet Take 500 mg by mouth as needed for pain.      Marland Kitchen amiodarone (PACERONE) 200 MG tablet Take 200 mg by mouth 2 (two) times daily.      Marland Kitchen atorvastatin (LIPITOR) 20 MG tablet Take 20 mg by mouth daily.      . Calcium Carb-Cholecalciferol (CALCIUM 500 +D) 500-400 MG-UNIT TABS Take by mouth daily.      . Cholecalciferol 400 UNITS CHEW Chew by mouth daily.      . cyanocobalamin (CVS VITAMIN B12) 1000 MCG tablet Take 3,000 mcg by mouth daily.       . fish oil-omega-3 fatty acids 1000 MG capsule Take 1 g by mouth daily.      . furosemide (LASIX) 40 MG tablet Take 1 tablet (40 mg total) by mouth 2 (two) times daily.  60 tablet  4  . Magnesium 250 MG TABS Take 250 mg by mouth daily.      . Multiple Vitamins-Minerals (CENTRUM SILVER PO) Take by mouth daily.      . potassium chloride SA (K-DUR,KLOR-CON) 20 MEQ tablet Take 20 mEq by mouth daily.      . traMADol (  ULTRAM) 50 MG tablet       . vitamin E (VITAMIN E) 400 UNIT capsule Take 400 Units by mouth daily.      Marland Kitchen warfarin (COUMADIN) 2.5 MG tablet Take 2.5 mg by mouth daily. 1 tablet on all days except 1/2 tablet on on fridays once daily       No current facility-administered medications for this visit.    Allergies:    Allergies  Allergen Reactions  . Demerol [Meperidine] Nausea And Vomiting    Social History:  The patient  reports that she quit smoking about 18 years ago. She does not have any smokeless tobacco history on file. She reports that she does not drink alcohol or use illicit drugs.   ROS:  Please see the history of present illness.   No CP, no SOB. Had  fall. Right knee swelling.   PHYSICAL EXAM: VS:  BP 140/70  Pulse 67  Ht 5\' 7"  (1.702 m)  Wt 176 lb (79.833 kg)  BMI 27.56 kg/m2 Well nourished, well developed, in no acute distress HEENT: normal Neck: no JVD Cardiac:  normal S1, S2; RRR; no murmur Lungs:  clear to auscultation bilaterally, no wheezing, rhonchi or rales Abd: soft, nontender, no hepatomegaly Ext: no edemaright knee effusion Skin: warm and dry Neuro: no focal abnormalities noted  EKG:  None today. Prior cardioversion.    ASSESSMENT AND PLAN:  1. Atrial flutter-s/p cardioversion. Amiodarone, will decrease to 200mg  once a day.    2. Chronic anticoagulation-Seeing Riki Rusk today.   3. Pacemaker-prior report noted. Atrial flutter since June.   4. Weight gain-likely diastolic dysfunction/mild diastolic heart failure. May be exacerbated by atrial flutter. Continue with Lasix doubled dose as prescribed.   Signed, Donato Schultz, MD [facility]  06/02/2013 2:20 PM

## 2013-06-03 LAB — MDC_IDC_ENUM_SESS_TYPE_REMOTE
Brady Statistic AP VS Percent: 23.78 %
Brady Statistic AS VP Percent: 3.1 %
Brady Statistic AS VS Percent: 65.81 %
Brady Statistic RV Percent Paced: 10.41 %
Lead Channel Impedance Value: 528 Ohm
Lead Channel Sensing Intrinsic Amplitude: 0.2608
Lead Channel Setting Pacing Amplitude: 2 V
Lead Channel Setting Pacing Amplitude: 3 V
Lead Channel Setting Pacing Pulse Width: 0.5 ms
Lead Channel Setting Sensing Sensitivity: 2.1 mV
Zone Setting Detection Interval: 400 ms
Zone Setting Detection Interval: 400 ms

## 2013-06-17 ENCOUNTER — Encounter: Payer: Self-pay | Admitting: *Deleted

## 2013-06-23 ENCOUNTER — Ambulatory Visit (INDEPENDENT_AMBULATORY_CARE_PROVIDER_SITE_OTHER): Payer: Medicare Other | Admitting: Pharmacist

## 2013-06-23 DIAGNOSIS — I4891 Unspecified atrial fibrillation: Secondary | ICD-10-CM

## 2013-06-23 LAB — POCT INR: INR: 2.4

## 2013-07-01 ENCOUNTER — Encounter: Payer: Self-pay | Admitting: Internal Medicine

## 2013-07-22 ENCOUNTER — Ambulatory Visit (INDEPENDENT_AMBULATORY_CARE_PROVIDER_SITE_OTHER): Payer: Medicare Other | Admitting: Pharmacist

## 2013-07-22 DIAGNOSIS — I4891 Unspecified atrial fibrillation: Secondary | ICD-10-CM

## 2013-07-22 LAB — POCT INR: INR: 4.4

## 2013-08-05 ENCOUNTER — Ambulatory Visit (INDEPENDENT_AMBULATORY_CARE_PROVIDER_SITE_OTHER): Payer: Medicare Other | Admitting: Pharmacist

## 2013-08-05 DIAGNOSIS — I4891 Unspecified atrial fibrillation: Secondary | ICD-10-CM

## 2013-08-05 LAB — POCT INR: INR: 2.3

## 2013-08-26 ENCOUNTER — Ambulatory Visit (INDEPENDENT_AMBULATORY_CARE_PROVIDER_SITE_OTHER): Payer: Medicare Other | Admitting: Pharmacist

## 2013-08-26 DIAGNOSIS — I4891 Unspecified atrial fibrillation: Secondary | ICD-10-CM

## 2013-08-26 LAB — POCT INR: INR: 2

## 2013-09-02 ENCOUNTER — Encounter (HOSPITAL_COMMUNITY): Payer: Self-pay | Admitting: Pharmacy Technician

## 2013-09-12 ENCOUNTER — Other Ambulatory Visit: Payer: Self-pay | Admitting: Orthopedic Surgery

## 2013-09-12 ENCOUNTER — Ambulatory Visit (INDEPENDENT_AMBULATORY_CARE_PROVIDER_SITE_OTHER): Payer: Medicare Other | Admitting: Pharmacist

## 2013-09-12 ENCOUNTER — Encounter (HOSPITAL_COMMUNITY)
Admission: RE | Admit: 2013-09-12 | Discharge: 2013-09-12 | Disposition: A | Discharge status: Home / Self Care | Payer: Medicare Other | Source: Ambulatory Visit | Attending: Orthopedic Surgery | Admitting: Orthopedic Surgery

## 2013-09-12 ENCOUNTER — Encounter (HOSPITAL_COMMUNITY): Payer: Self-pay

## 2013-09-12 ENCOUNTER — Telehealth: Payer: Self-pay | Admitting: Pharmacist

## 2013-09-12 ENCOUNTER — Ambulatory Visit (HOSPITAL_COMMUNITY)
Admission: RE | Admit: 2013-09-12 | Discharge: 2013-09-12 | Disposition: A | Discharge status: Home / Self Care | Payer: Medicare Other | Source: Ambulatory Visit | Attending: Anesthesiology | Admitting: Anesthesiology

## 2013-09-12 DIAGNOSIS — Z01812 Encounter for preprocedural laboratory examination: Secondary | ICD-10-CM | POA: Insufficient documentation

## 2013-09-12 DIAGNOSIS — I4891 Unspecified atrial fibrillation: Secondary | ICD-10-CM

## 2013-09-12 DIAGNOSIS — Z01818 Encounter for other preprocedural examination: Secondary | ICD-10-CM | POA: Insufficient documentation

## 2013-09-12 DIAGNOSIS — Z01811 Encounter for preprocedural respiratory examination: Secondary | ICD-10-CM

## 2013-09-12 HISTORY — DX: Anemia, unspecified: D64.9

## 2013-09-12 LAB — BASIC METABOLIC PANEL
BUN: 22 mg/dL (ref 6–23)
CO2: 28 meq/L (ref 19–32)
Calcium: 9.3 mg/dL (ref 8.4–10.5)
Chloride: 102 mEq/L (ref 96–112)
Creatinine, Ser: 1.01 mg/dL (ref 0.50–1.10)
GFR calc Af Amer: 57 mL/min — ABNORMAL LOW (ref >90)
GFR calc non Af Amer: 49 mL/min — ABNORMAL LOW (ref >90)
GLUCOSE: 101 mg/dL — AB (ref 70–99)
Potassium: 3.9 mEq/L (ref 3.7–5.3)
Sodium: 143 mEq/L (ref 137–147)

## 2013-09-12 LAB — POCT INR: INR: 2

## 2013-09-12 LAB — CBC
HCT: 43.1 % (ref 36.0–46.0)
Hemoglobin: 14.2 g/dL (ref 12.0–15.0)
MCH: 31.4 pg (ref 26.0–34.0)
MCHC: 32.9 g/dL (ref 30.0–36.0)
MCV: 95.4 fL (ref 78.0–100.0)
PLATELETS: 286 10*3/uL (ref 150–400)
RBC: 4.52 MIL/uL (ref 3.87–5.11)
RDW: 14.6 % (ref 11.5–15.5)
WBC: 8.4 10*3/uL (ref 4.0–10.5)

## 2013-09-12 LAB — PROTIME-INR
INR: 2.05 — ABNORMAL HIGH (ref 0.00–1.49)
Prothrombin Time: 22.5 seconds — ABNORMAL HIGH (ref 11.6–15.2)

## 2013-09-12 LAB — APTT: aPTT: 30 seconds (ref 24–37)

## 2013-09-12 LAB — TYPE AND SCREEN
ABO/RH(D): B POS
Antibody Screen: NEGATIVE

## 2013-09-12 LAB — SURGICAL PCR SCREEN
MRSA, PCR: NEGATIVE
Staphylococcus aureus: NEGATIVE

## 2013-09-12 LAB — ABO/RH: ABO/RH(D): B POS

## 2013-09-12 MED ORDER — ENOXAPARIN SODIUM 80 MG/0.8ML ~~LOC~~ SOLN: 80.0000 mg | Freq: Two times a day (BID) | SUBCUTANEOUS | Status: DC

## 2013-09-12 NOTE — Pre-Procedure Instructions (Signed)
Cheryl [name]  09/12/2013  Your procedure is scheduled on:  09-19-2013    Monday   [facility]  2 * 3 at 5:30 AM.   Call this number if you have problems the morning of surgery: [phone]   Remember:   Do not eat food or drink liquids after midnight.    Take these medicines the morning of surgery with A SIP OF WATER: aminodarone,eye gtts,pain medication as needed   Do not wear jewelry, make-up or nail polish .  Do not wear lotions, powders, or perfumes.   Do not shave 48 hours prior to surgery.   Do not bring valuables to the hospital.  [facility] is not responsible for any belongings or valuables.               Contacts, dentures or bridgework may not be worn into surgery.   Leave suitcase in the car. After surgery it may be brought to your room.  For patients admitted to the hospital, discharge time is determined by your treatment team.          Please read over the following fact sheets that you were given: Pain Booklet, Coughing and Deep Breathing, Blood Transfusion Information and Surgical Site Infection Prevention

## 2013-09-12 NOTE — Patient Instructions (Addendum)
You will have to hold your coumadin and use Lovenox (enoxaparin) 5 days prior to your procedure.   Wednesday 4/01 No Coumadin, No Lovenox Thursday 4/02 No Coumadin, Lovenox 80mg  under the skin in the AM, Lovenox 80mg  under the skin in the PM Friday   4/03 No Coumadin, Lovenox 80mg  under the skin in the AM, Lovenox 80mg  under the skin in the PM Saturday  4/04 No Coumadin, Lovenox 80mg  under the skin in the AM, Lovenox 80mg  under the skin in the PM Sunday  4/05 No Coumadin, Lovenox 80mg  under the skin in the AM, No Lovenox in the PM  Please follow-up with surgery team for resuming of Coumadin.

## 2013-09-12 NOTE — Telephone Encounter (Signed)
Spoke with Dr. Anne FuSkains today regarding patient's upcoming total knee replacement by Dr. Turner Danielsowan.  Patient has h/o AFib and CVA and treated with Coumadin.  Patient will be in skilled nursing facility [facility] following surgery.  Dr. Anne FuSkains states he is okay with patient proceeding with knee surgery but need to bridge with lovenox given h/o AFib and CVA.  Patient was given instruction while in office today.  Instructions given on how to bridge pre-op.  Post-op lovenox need will be addressed in hospital and skilled nursing if needed.    To Dr. Anne FuSkains.

## 2013-09-12 NOTE — Progress Notes (Signed)
Peri-operative implanted device orders faxed to [facility].

## 2013-09-12 NOTE — Telephone Encounter (Signed)
Agree with plan [facility]. At last clinic visit, no complaints of angina, syncope, heart failure. I think that bridging with Lovenox makes sense given her prior stroke history.

## 2013-09-13 NOTE — Progress Notes (Addendum)
Anesthesia Chart Review:  Patient is a 80 year old female scheduled for right TKA on 09/19/13 by Dr. Turner Danielsowan.  History includes afib/flutter s/p cardioversion 03/2010 and 03/2013 afib, SSS/tachy-brady syndrome s/p Medtronic PPM 03/2012, HTN, HLD, diastolic CHF, CKD stage III, anemia, osteoarthritis, former smoker. PCP is Dr. Johny BlamerWilliam Harris.  [facility].  Her cardiologist is Dr. Anne FuSkains who felt she was okay to proceed with surgery, but recommended a Lovenox bridge. Her BP was elevated at PAT. Unfortunately it was not rechecked.  Her previous BP at Dr. Anne FuSkains office was 140/70 in 05/2013.  EKG on 04/17/13 interpreted as SR with first degree AVB, septal infarct (age undetermined).  Echo on 01/17/10 [facility]; copy obtained from Dr. Anne FuSkains) showed: LV size and systolic function are normal EF 55%.  No wall motion abnormalities detected.  Diastolic function is difficult to assess.  Rhythm is afib.  Moderate bi-atrial enlargement.  Mild concentric LVH. Mild AR/MR/TR.  CXR on 09/12/13 showed: 1. Question of left lung base mass.  2. Further evaluation is recommended. CT of the chest with contrast is recommended.  3. Bronchitic changes without focal pulmonary infection. She is scheduled for a chest CT on 09/15/13 at 1500 per Dr. Turner Danielsowan.  Preoperative labs noted. Coumadin to be held starting 09/14/13 with plans for Lovenox bridge. Repeat PT/PTT on the day of surgery. Orders were pending at that time of her PAT visit, so any additional orders per surgeon will have to be done on the day of surgery.  She has been cleared by cardiology.  If CT scan findings don't suggest anything that needs immediate attention and follow-up labs are acceptable then I would anticipate that she could proceed as planned.  Velna Ochsllison Kishia Shackett, PA-C [facility] Phone [phone] 09/13/2013 12:29 PM  Addendum: Chest CT scan done on 09/15/13 for evaluation of abnormal CXR showed: 1. There is  nodular eventration of left posterior medial diaphragm measures 3 x 2.3 cm containing fat. This corresponds to chest x-ray abnormality. No pleural based mass is noted.  2. No acute infiltrate or pulmonary edema.  3. There is dextroscoliosis and degenerative changes of thoracic spine are noted.  4. Dual lead cardiac pacemaker in place.  5. No mediastinal hematoma or adenopathy.  6. Left hepatic lobe cyst measures 3 cm.

## 2013-09-15 ENCOUNTER — Ambulatory Visit
Admission: RE | Admit: 2013-09-15 | Discharge: 2013-09-15 | Disposition: A | Discharge status: Home / Self Care | Payer: Medicare Other | Source: Ambulatory Visit | Attending: Orthopedic Surgery | Admitting: Orthopedic Surgery

## 2013-09-15 DIAGNOSIS — Z01811 Encounter for preprocedural respiratory examination: Secondary | ICD-10-CM

## 2013-09-17 NOTE — H&P (Signed)
TOTAL KNEE ADMISSION H&P  Patient is being admitted for right total knee arthroplasty.  Subjective:  Chief Complaint:right knee pain.  HPI: Cheryl [name], 80 y.o. female, has a history of pain and functional disability in the right knee due to arthritis and has failed non-surgical conservative treatments for greater than 12 weeks to includeNSAID's and/or analgesics, corticosteriod injections, use of assistive devices and activity modification.  Onset of symptoms was gradual, starting 1 years ago with gradually worsening course since that time. The patient noted no past surgery on the right knee(s).  Patient currently rates pain in the right knee(s) at 10 out of 10 with activity. Patient has worsening of pain with activity and weight bearing, pain that interferes with activities of daily living, pain with passive range of motion, crepitus and joint swelling.  Patient has evidence of periarticular osteophytes and joint space narrowing by imaging studies. . There is no active infection.  Patient Active Problem List   Diagnosis Date Noted  . Atrial flutter 03/30/2013  . Chronic anticoagulation   . Hyperlipidemia   . Hypertension   . CKD (chronic kidney disease), stage III   . Osteoarthritis   . Pacemaker   . Atrial fibrillation 03/24/2013   Past Medical History  Diagnosis Date  . Atrial fibrillation     Prior cardioversion 10/11  . Chronic anticoagulation   . Hyperlipidemia   . Hypertension   . Pacemaker     Medtronic-10/13 implantation  . CKD (chronic kidney disease), stage III   . Osteoarthritis   . Anemia   . Dysrhythmia     Past Surgical History  Procedure Laterality Date  . Tonsillectomy    . Abdominal hysterectomy    . Appendectomy    . Cardioversion N/A 04/04/2013    Procedure: CARDIOVERSION;  Surgeon: Donato Schultz, MD;  Location: [facility] ENDOSCOPY;  Service: Cardiovascular;  Laterality: N/A;  . Eye surgery Bilateral     cateracts    No prescriptions prior to admission    Allergies  Allergen Reactions  . Demerol [Meperidine] Nausea And Vomiting    History  Substance Use Topics  . Smoking status: Former Smoker    Quit date: 06/15/1994  . Smokeless tobacco: Not on file  . Alcohol Use: No    Family History  Problem Relation Age of Onset  . Stroke Mother   . Heart Problems Father   . Hypertension      family history  . Diabetes      family history     Review of Systems  Constitutional: Negative.   Eyes:       Wears glasses  Respiratory: Negative.   Cardiovascular: Negative.   Gastrointestinal: Negative.   Genitourinary: Negative.   Musculoskeletal: Positive for joint pain.  Skin: Negative.   Neurological: Negative.   Endo/Heme/Allergies: Bruises/bleeds easily.  Psychiatric/Behavioral: Negative.     Objective:  Physical Exam  Constitutional: She is oriented to person, place, and time. She appears well-developed and well-nourished.  HENT:  Head: Normocephalic and atraumatic.  Eyes: Pupils are equal, round, and reactive to light.  Neck: Normal range of motion. Neck supple.  Cardiovascular: Intact distal pulses.   Respiratory: Effort normal.  Musculoskeletal: She exhibits tenderness.  The patient has an exquisitely tender patellofemoral joint one plus effusion, collateral ligaments are stable she is tender along the medial joint line.  She is neurovascular intact.  Her toes are pink.  Her skin is intact.  Neurological: She is alert and oriented to person, place, and time.  Skin: Skin is warm and dry.  Psychiatric: She has a normal mood and affect. Her behavior is normal. Judgment and thought content normal.    Vital signs in last 24 hours:    Labs:   Estimated body mass index is 27.56 kg/(m^2) as calculated from the following:   Height as of 06/02/13: 5\' 7"  (1.702 m).   Weight as of 06/02/13: 79.833 kg (176 lb).   Imaging Review Radiographs:  X-rays were ordered, performed, and interpreted by me today included; AP,  Rosenberg, lateral and sunrise x-rays show end-stage erosive arthritis of the lateral facet of the patella chondrocalcinosis with calcium in the menisci but good maintenance of cartilage height between the femur and the tibia.  Assessment/Plan:  End stage arthritis, right knee   The patient history, physical examination, clinical judgment of the provider and imaging studies are consistent with end stage degenerative joint disease of the right knee(s) and total knee arthroplasty is deemed medically necessary. The treatment options including medical management, injection therapy arthroscopy and arthroplasty were discussed at length. The risks and benefits of total knee arthroplasty were presented and reviewed. The risks due to aseptic loosening, infection, stiffness, patella tracking problems, thromboembolic complications and other imponderables were discussed. The patient acknowledged the explanation, agreed to proceed with the plan and consent was signed. Patient is being admitted for inpatient treatment for surgery, pain control, PT, OT, prophylactic antibiotics, VTE prophylaxis, progressive ambulation and ADL's and discharge planning. The patient is planning to be discharged to skilled nursing facility

## 2013-09-18 MED ORDER — DEXTROSE-NACL 5-0.45 % IV SOLN: INTRAVENOUS | Status: DC

## 2013-09-18 MED ORDER — CHLORHEXIDINE GLUCONATE 4 % EX LIQD
60.0000 mL | Freq: Once | CUTANEOUS | Status: DC
  Filled 2013-09-18: qty 60

## 2013-09-18 MED ORDER — CEFAZOLIN SODIUM-DEXTROSE 2-3 GM-% IV SOLR
2.0000 g | INTRAVENOUS | Status: AC
  Administered 2013-09-19: 2 g via INTRAVENOUS
  Filled 2013-09-18: qty 50

## 2013-09-19 ENCOUNTER — Inpatient Hospital Stay (HOSPITAL_COMMUNITY)
Admission: RE | Admit: 2013-09-19 | Discharge: 2013-09-22 | DRG: 470 | Disposition: A | Discharge status: Skilled Nursing Facility | Payer: Medicare Other | Source: Ambulatory Visit | Attending: Orthopedic Surgery | Admitting: Orthopedic Surgery

## 2013-09-19 ENCOUNTER — Encounter (HOSPITAL_COMMUNITY)
Admission: RE | Disposition: A | Discharge status: Skilled Nursing Facility | Payer: Self-pay | Source: Ambulatory Visit | Attending: Orthopedic Surgery

## 2013-09-19 ENCOUNTER — Encounter (HOSPITAL_COMMUNITY): Payer: Self-pay

## 2013-09-19 ENCOUNTER — Inpatient Hospital Stay (HOSPITAL_COMMUNITY): Payer: Medicare Other | Admitting: Vascular Surgery

## 2013-09-19 ENCOUNTER — Encounter (HOSPITAL_COMMUNITY): Payer: Medicare Other | Admitting: Vascular Surgery

## 2013-09-19 DIAGNOSIS — Z7901 Long term (current) use of anticoagulants: Secondary | ICD-10-CM

## 2013-09-19 DIAGNOSIS — I4892 Unspecified atrial flutter: Secondary | ICD-10-CM | POA: Diagnosis present

## 2013-09-19 DIAGNOSIS — I4891 Unspecified atrial fibrillation: Secondary | ICD-10-CM | POA: Diagnosis present

## 2013-09-19 DIAGNOSIS — Z823 Family history of stroke: Secondary | ICD-10-CM

## 2013-09-19 DIAGNOSIS — Z950 Presence of cardiac pacemaker: Secondary | ICD-10-CM

## 2013-09-19 DIAGNOSIS — M1711 Unilateral primary osteoarthritis, right knee: Secondary | ICD-10-CM | POA: Diagnosis present

## 2013-09-19 DIAGNOSIS — I129 Hypertensive chronic kidney disease with stage 1 through stage 4 chronic kidney disease, or unspecified chronic kidney disease: Secondary | ICD-10-CM | POA: Diagnosis present

## 2013-09-19 DIAGNOSIS — M1710 Unilateral primary osteoarthritis, unspecified knee: Principal | ICD-10-CM | POA: Diagnosis present

## 2013-09-19 DIAGNOSIS — N183 Chronic kidney disease, stage 3 unspecified: Secondary | ICD-10-CM | POA: Diagnosis present

## 2013-09-19 DIAGNOSIS — E785 Hyperlipidemia, unspecified: Secondary | ICD-10-CM | POA: Diagnosis present

## 2013-09-19 DIAGNOSIS — Z87891 Personal history of nicotine dependence: Secondary | ICD-10-CM

## 2013-09-19 HISTORY — DX: Other chronic pain: G89.29

## 2013-09-19 HISTORY — PX: TOTAL KNEE ARTHROPLASTY: SHX125

## 2013-09-19 HISTORY — DX: Pain in right hip: M25.551

## 2013-09-19 LAB — URINALYSIS, ROUTINE W REFLEX MICROSCOPIC
Bilirubin Urine: NEGATIVE
GLUCOSE, UA: NEGATIVE mg/dL
Hgb urine dipstick: NEGATIVE
Ketones, ur: NEGATIVE mg/dL
LEUKOCYTES UA: NEGATIVE
Nitrite: NEGATIVE
PROTEIN: NEGATIVE mg/dL
Specific Gravity, Urine: 1.018 (ref 1.005–1.030)
Urobilinogen, UA: 0.2 mg/dL (ref 0.0–1.0)
pH: 7.5 (ref 5.0–8.0)

## 2013-09-19 LAB — PROTIME-INR
INR: 1.17 (ref 0.00–1.49)
Prothrombin Time: 14.7 seconds (ref 11.6–15.2)

## 2013-09-19 LAB — CBC WITH DIFFERENTIAL/PLATELET
BASOS ABS: 0 10*3/uL (ref 0.0–0.1)
Basophils Relative: 1 % (ref 0–1)
EOS PCT: 3 % (ref 0–5)
Eosinophils Absolute: 0.2 10*3/uL (ref 0.0–0.7)
HCT: 38.9 % (ref 36.0–46.0)
Hemoglobin: 13 g/dL (ref 12.0–15.0)
LYMPHS PCT: 25 % (ref 12–46)
Lymphs Abs: 1.3 10*3/uL (ref 0.7–4.0)
MCH: 31.9 pg (ref 26.0–34.0)
MCHC: 33.4 g/dL (ref 30.0–36.0)
MCV: 95.3 fL (ref 78.0–100.0)
Monocytes Absolute: 0.7 10*3/uL (ref 0.1–1.0)
Monocytes Relative: 13 % — ABNORMAL HIGH (ref 3–12)
NEUTROS PCT: 58 % (ref 43–77)
Neutro Abs: 3 10*3/uL (ref 1.7–7.7)
Platelets: 242 10*3/uL (ref 150–400)
RBC: 4.08 MIL/uL (ref 3.87–5.11)
RDW: 14.7 % (ref 11.5–15.5)
WBC: 5.1 10*3/uL (ref 4.0–10.5)

## 2013-09-19 LAB — APTT: aPTT: 31 seconds (ref 24–37)

## 2013-09-19 SURGERY — ARTHROPLASTY, KNEE, TOTAL
Anesthesia: Regional | Site: Knee | Laterality: Right

## 2013-09-19 MED ORDER — PROPOFOL 10 MG/ML IV BOLUS
INTRAVENOUS | Status: DC | PRN
  Administered 2013-09-19: 50 mg via INTRAVENOUS
  Administered 2013-09-19: 150 mg via INTRAVENOUS

## 2013-09-19 MED ORDER — AMIODARONE HCL 200 MG PO TABS
200.0000 mg | ORAL_TABLET | Freq: Every day | ORAL | Status: DC
  Administered 2013-09-20 – 2013-09-22 (×3): 200 mg via ORAL
  Filled 2013-09-19 (×4): qty 1

## 2013-09-19 MED ORDER — DEXTROSE 5 % IV SOLN: 500.0000 mg | Freq: Four times a day (QID) | INTRAVENOUS | Status: DC | PRN

## 2013-09-19 MED ORDER — WARFARIN 1.25 MG HALF TABLET
1.2500 mg | ORAL_TABLET | ORAL | Status: DC
  Administered 2013-09-19 – 2013-09-21 (×2): 1.25 mg via ORAL
  Filled 2013-09-19 (×2): qty 1

## 2013-09-19 MED ORDER — KCL IN DEXTROSE-NACL 20-5-0.45 MEQ/L-%-% IV SOLN
INTRAVENOUS | Status: DC
  Administered 2013-09-19: 14:00:00 via INTRAVENOUS
  Filled 2013-09-19 (×11): qty 1000

## 2013-09-19 MED ORDER — BUPIVACAINE-EPINEPHRINE PF 0.5-1:200000 % IJ SOLN
INTRAMUSCULAR | Status: DC | PRN
  Administered 2013-09-19: 30 mL via PERINEURAL

## 2013-09-19 MED ORDER — SENNOSIDES-DOCUSATE SODIUM 8.6-50 MG PO TABS: 1.0000 | ORAL_TABLET | Freq: Every evening | ORAL | Status: DC | PRN

## 2013-09-19 MED ORDER — OXYCODONE HCL 5 MG PO TABS
5.0000 mg | ORAL_TABLET | ORAL | Status: DC | PRN
  Administered 2013-09-19 – 2013-09-22 (×9): 10 mg via ORAL
  Filled 2013-09-19 (×9): qty 2

## 2013-09-19 MED ORDER — KETOROLAC TROMETHAMINE 0.5 % OP SOLN
1.0000 [drp] | Freq: Every morning | OPHTHALMIC | Status: DC
  Administered 2013-09-21: 1 [drp] via OPHTHALMIC
  Filled 2013-09-19: qty 3

## 2013-09-19 MED ORDER — OXYCODONE HCL 5 MG/5ML PO SOLN: 5.0000 mg | Freq: Once | ORAL | Status: DC | PRN

## 2013-09-19 MED ORDER — POTASSIUM CHLORIDE CRYS ER 20 MEQ PO TBCR
20.0000 meq | EXTENDED_RELEASE_TABLET | Freq: Every day | ORAL | Status: DC
  Administered 2013-09-19 – 2013-09-22 (×4): 20 meq via ORAL
  Filled 2013-09-19 (×4): qty 1

## 2013-09-19 MED ORDER — PHENOL 1.4 % MT LIQD: 1.0000 | OROMUCOSAL | Status: DC | PRN

## 2013-09-19 MED ORDER — ONDANSETRON HCL 4 MG/2ML IJ SOLN
4.0000 mg | Freq: Four times a day (QID) | INTRAMUSCULAR | Status: AC | PRN
  Administered 2013-09-19: 4 mg via INTRAVENOUS

## 2013-09-19 MED ORDER — DOCUSATE SODIUM 100 MG PO CAPS
100.0000 mg | ORAL_CAPSULE | Freq: Two times a day (BID) | ORAL | Status: DC
  Administered 2013-09-19 – 2013-09-22 (×5): 100 mg via ORAL
  Filled 2013-09-19 (×7): qty 1

## 2013-09-19 MED ORDER — HYDROMORPHONE HCL PF 1 MG/ML IJ SOLN
INTRAMUSCULAR | Status: AC
  Filled 2013-09-19: qty 1

## 2013-09-19 MED ORDER — ONDANSETRON HCL 4 MG PO TABS: 4.0000 mg | ORAL_TABLET | Freq: Four times a day (QID) | ORAL | Status: DC | PRN

## 2013-09-19 MED ORDER — FENTANYL CITRATE 0.05 MG/ML IJ SOLN
INTRAMUSCULAR | Status: DC | PRN
  Administered 2013-09-19 (×3): 50 ug via INTRAVENOUS
  Administered 2013-09-19: 100 ug via INTRAVENOUS
  Administered 2013-09-19 (×5): 50 ug via INTRAVENOUS

## 2013-09-19 MED ORDER — EPHEDRINE SULFATE 50 MG/ML IJ SOLN
INTRAMUSCULAR | Status: AC
  Filled 2013-09-19: qty 1

## 2013-09-19 MED ORDER — METOCLOPRAMIDE HCL 5 MG/ML IJ SOLN: 5.0000 mg | Freq: Three times a day (TID) | INTRAMUSCULAR | Status: DC | PRN

## 2013-09-19 MED ORDER — METHOCARBAMOL 500 MG PO TABS
ORAL_TABLET | ORAL | Status: AC
  Filled 2013-09-19: qty 1

## 2013-09-19 MED ORDER — GLYCOPYRROLATE 0.2 MG/ML IJ SOLN
INTRAMUSCULAR | Status: AC
  Filled 2013-09-19: qty 1

## 2013-09-19 MED ORDER — WARFARIN - PHYSICIAN DOSING INPATIENT
Freq: Every day | Status: DC
  Administered 2013-09-19: 18:00:00

## 2013-09-19 MED ORDER — SODIUM CHLORIDE 0.9 % IJ SOLN
INTRAMUSCULAR | Status: DC | PRN
  Administered 2013-09-19: 40 mL via INTRAVENOUS

## 2013-09-19 MED ORDER — BUPIVACAINE LIPOSOME 1.3 % IJ SUSP
20.0000 mL | INTRAMUSCULAR | Status: DC
  Filled 2013-09-19: qty 20

## 2013-09-19 MED ORDER — FENTANYL CITRATE 0.05 MG/ML IJ SOLN
INTRAMUSCULAR | Status: AC
  Filled 2013-09-19: qty 2

## 2013-09-19 MED ORDER — WARFARIN SODIUM 2.5 MG PO TABS
2.5000 mg | ORAL_TABLET | ORAL | Status: DC
  Administered 2013-09-20: 2.5 mg via ORAL
  Filled 2013-09-19 (×2): qty 1

## 2013-09-19 MED ORDER — ONDANSETRON HCL 4 MG/2ML IJ SOLN: 4.0000 mg | Freq: Four times a day (QID) | INTRAMUSCULAR | Status: DC | PRN

## 2013-09-19 MED ORDER — ROCURONIUM BROMIDE 50 MG/5ML IV SOLN
INTRAVENOUS | Status: AC
  Filled 2013-09-19: qty 1

## 2013-09-19 MED ORDER — ONDANSETRON HCL 4 MG/2ML IJ SOLN
INTRAMUSCULAR | Status: AC
  Filled 2013-09-19: qty 2

## 2013-09-19 MED ORDER — AMIODARONE HCL 200 MG PO TABS
200.0000 mg | ORAL_TABLET | Freq: Every day | ORAL | Status: DC
  Administered 2013-09-19: 200 mg via ORAL
  Filled 2013-09-19: qty 1

## 2013-09-19 MED ORDER — ONDANSETRON HCL 4 MG/2ML IJ SOLN
INTRAMUSCULAR | Status: DC | PRN
  Administered 2013-09-19: 4 mg via INTRAVENOUS

## 2013-09-19 MED ORDER — PROPOFOL 10 MG/ML IV BOLUS
INTRAVENOUS | Status: AC
  Filled 2013-09-19: qty 20

## 2013-09-19 MED ORDER — ACETAMINOPHEN 325 MG PO TABS
650.0000 mg | ORAL_TABLET | Freq: Four times a day (QID) | ORAL | Status: DC | PRN
  Filled 2013-09-19: qty 2

## 2013-09-19 MED ORDER — MENTHOL 3 MG MT LOZG: 1.0000 | LOZENGE | OROMUCOSAL | Status: DC | PRN

## 2013-09-19 MED ORDER — OXYCODONE HCL 5 MG PO TABS: 5.0000 mg | ORAL_TABLET | Freq: Once | ORAL | Status: DC | PRN

## 2013-09-19 MED ORDER — FENTANYL CITRATE 0.05 MG/ML IJ SOLN
INTRAMUSCULAR | Status: AC
  Filled 2013-09-19: qty 5

## 2013-09-19 MED ORDER — CEFUROXIME SODIUM 1.5 G IJ SOLR
INTRAMUSCULAR | Status: DC | PRN
  Administered 2013-09-19: 1.5 g

## 2013-09-19 MED ORDER — MIDAZOLAM HCL 2 MG/2ML IJ SOLN
INTRAMUSCULAR | Status: AC
  Filled 2013-09-19: qty 2

## 2013-09-19 MED ORDER — ACETAMINOPHEN 650 MG RE SUPP: 650.0000 mg | Freq: Four times a day (QID) | RECTAL | Status: DC | PRN

## 2013-09-19 MED ORDER — PHENYLEPHRINE 40 MCG/ML (10ML) SYRINGE FOR IV PUSH (FOR BLOOD PRESSURE SUPPORT)
PREFILLED_SYRINGE | INTRAVENOUS | Status: AC
  Filled 2013-09-19: qty 10

## 2013-09-19 MED ORDER — WARFARIN SODIUM 2.5 MG PO TABS: 2.5000 mg | ORAL_TABLET | ORAL | Status: DC

## 2013-09-19 MED ORDER — METHOCARBAMOL 500 MG PO TABS
500.0000 mg | ORAL_TABLET | Freq: Four times a day (QID) | ORAL | Status: DC | PRN
  Administered 2013-09-19 (×2): 500 mg via ORAL
  Filled 2013-09-19: qty 1

## 2013-09-19 MED ORDER — PREDNISOLONE ACETATE 1 % OP SUSP
1.0000 [drp] | Freq: Three times a day (TID) | OPHTHALMIC | Status: DC
  Filled 2013-09-19: qty 1

## 2013-09-19 MED ORDER — BISACODYL 5 MG PO TBEC: 5.0000 mg | DELAYED_RELEASE_TABLET | Freq: Every day | ORAL | Status: DC | PRN

## 2013-09-19 MED ORDER — ALUM & MAG HYDROXIDE-SIMETH 200-200-20 MG/5ML PO SUSP: 30.0000 mL | ORAL | Status: DC | PRN

## 2013-09-19 MED ORDER — ATORVASTATIN CALCIUM 20 MG PO TABS
20.0000 mg | ORAL_TABLET | Freq: Every day | ORAL | Status: DC
  Administered 2013-09-19 – 2013-09-22 (×4): 20 mg via ORAL
  Filled 2013-09-19 (×4): qty 1

## 2013-09-19 MED ORDER — FLEET ENEMA 7-19 GM/118ML RE ENEM: 1.0000 | ENEMA | Freq: Once | RECTAL | Status: AC | PRN

## 2013-09-19 MED ORDER — LIDOCAINE HCL (CARDIAC) 20 MG/ML IV SOLN
INTRAVENOUS | Status: DC | PRN
Start: 2013-09-19 — End: 2013-09-19
  Administered 2013-09-19: 70 mg via INTRAVENOUS

## 2013-09-19 MED ORDER — CEFUROXIME SODIUM 1.5 G IJ SOLR
INTRAMUSCULAR | Status: AC
  Filled 2013-09-19: qty 1.5

## 2013-09-19 MED ORDER — HYDROMORPHONE HCL PF 1 MG/ML IJ SOLN
1.0000 mg | INTRAMUSCULAR | Status: DC | PRN
  Administered 2013-09-19 (×2): 1 mg via INTRAVENOUS
  Filled 2013-09-19: qty 1

## 2013-09-19 MED ORDER — FENTANYL CITRATE 0.05 MG/ML IJ SOLN
25.0000 ug | INTRAMUSCULAR | Status: DC | PRN
  Administered 2013-09-19 (×4): 25 ug via INTRAVENOUS

## 2013-09-19 MED ORDER — SODIUM CHLORIDE 0.9 % IR SOLN
Status: DC | PRN
  Administered 2013-09-19: 1000 mL

## 2013-09-19 MED ORDER — LIDOCAINE HCL (CARDIAC) 20 MG/ML IV SOLN
INTRAVENOUS | Status: AC
  Filled 2013-09-19: qty 5

## 2013-09-19 MED ORDER — DIPHENHYDRAMINE HCL 12.5 MG/5ML PO ELIX: 12.5000 mg | ORAL_SOLUTION | ORAL | Status: DC | PRN

## 2013-09-19 MED ORDER — BUPIVACAINE LIPOSOME 1.3 % IJ SUSP
INTRAMUSCULAR | Status: DC | PRN
  Administered 2013-09-19: 20 mL

## 2013-09-19 MED ORDER — SODIUM CHLORIDE 0.9 % IJ SOLN
INTRAMUSCULAR | Status: AC
  Filled 2013-09-19: qty 10

## 2013-09-19 MED ORDER — LACTATED RINGERS IV SOLN
INTRAVENOUS | Status: DC | PRN
  Administered 2013-09-19 (×2): via INTRAVENOUS

## 2013-09-19 MED ORDER — FUROSEMIDE 40 MG PO TABS
40.0000 mg | ORAL_TABLET | Freq: Two times a day (BID) | ORAL | Status: DC
  Administered 2013-09-19 – 2013-09-22 (×6): 40 mg via ORAL
  Filled 2013-09-19 (×8): qty 1

## 2013-09-19 MED ORDER — 0.9 % SODIUM CHLORIDE (POUR BTL) OPTIME
TOPICAL | Status: DC | PRN
  Administered 2013-09-19: 1000 mL

## 2013-09-19 MED ORDER — SUCCINYLCHOLINE CHLORIDE 20 MG/ML IJ SOLN
INTRAMUSCULAR | Status: AC
  Filled 2013-09-19: qty 1

## 2013-09-19 MED ORDER — METOCLOPRAMIDE HCL 5 MG PO TABS
5.0000 mg | ORAL_TABLET | Freq: Three times a day (TID) | ORAL | Status: DC | PRN
  Filled 2013-09-19: qty 2

## 2013-09-19 SURGICAL SUPPLY — 59 items
BANDAGE ELASTIC 6 VELCRO ST LF (GAUZE/BANDAGES/DRESSINGS) ×3 IMPLANT
BANDAGE ESMARK 6X9 LF (GAUZE/BANDAGES/DRESSINGS) ×1 IMPLANT
BLADE SAG 18X100X1.27 (BLADE) ×3 IMPLANT
BLADE SAW SGTL 13X75X1.27 (BLADE) ×3 IMPLANT
BLADE SURG ROTATE 9660 (MISCELLANEOUS) IMPLANT
BNDG ELASTIC 6X10 VLCR STRL LF (GAUZE/BANDAGES/DRESSINGS) ×3 IMPLANT
BNDG ESMARK 6X9 LF (GAUZE/BANDAGES/DRESSINGS) ×3
BOWL SMART MIX CTS (DISPOSABLE) ×3 IMPLANT
CAPT RP KNEE ×3 IMPLANT
CEMENT HV SMART SET (Cement) ×6 IMPLANT
COVER SURGICAL LIGHT HANDLE (MISCELLANEOUS) ×3 IMPLANT
CUFF TOURNIQUET SINGLE 34IN LL (TOURNIQUET CUFF) ×3 IMPLANT
CUFF TOURNIQUET SINGLE 44IN (TOURNIQUET CUFF) IMPLANT
DRAPE EXTREMITY T 121X128X90 (DRAPE) ×3 IMPLANT
DRAPE U-SHAPE 47X51 STRL (DRAPES) ×3 IMPLANT
DURAPREP 26ML APPLICATOR (WOUND CARE) ×6 IMPLANT
ELECT REM PT RETURN 9FT ADLT (ELECTROSURGICAL) ×3
ELECTRODE REM PT RTRN 9FT ADLT (ELECTROSURGICAL) ×1 IMPLANT
EVACUATOR 1/8 PVC DRAIN (DRAIN) ×3 IMPLANT
GAUZE XEROFORM 1X8 LF (GAUZE/BANDAGES/DRESSINGS) ×3 IMPLANT
GLOVE BIO SURGEON STRL SZ7.5 (GLOVE) ×3 IMPLANT
GLOVE BIO SURGEON STRL SZ8.5 (GLOVE) ×3 IMPLANT
GLOVE BIOGEL PI IND STRL 8 (GLOVE) ×1 IMPLANT
GLOVE BIOGEL PI IND STRL 9 (GLOVE) ×1 IMPLANT
GLOVE BIOGEL PI INDICATOR 8 (GLOVE) ×2
GLOVE BIOGEL PI INDICATOR 9 (GLOVE) ×2
GOWN STRL REUS W/ TWL LRG LVL3 (GOWN DISPOSABLE) ×1 IMPLANT
GOWN STRL REUS W/ TWL XL LVL3 (GOWN DISPOSABLE) ×3 IMPLANT
GOWN STRL REUS W/TWL LRG LVL3 (GOWN DISPOSABLE) ×2
GOWN STRL REUS W/TWL XL LVL3 (GOWN DISPOSABLE) ×6
HANDPIECE INTERPULSE COAX TIP (DISPOSABLE) ×2
HOOD PEEL AWAY FACE SHEILD DIS (HOOD) ×6 IMPLANT
KIT BASIN OR (CUSTOM PROCEDURE TRAY) ×3 IMPLANT
KIT ROOM TURNOVER OR (KITS) ×3 IMPLANT
MANIFOLD NEPTUNE II (INSTRUMENTS) ×3 IMPLANT
NDL SAFETY ECLIPSE 18X1.5 (NEEDLE) IMPLANT
NEEDLE 22X1 1/2 (OR ONLY) (NEEDLE) ×3 IMPLANT
NEEDLE HYPO 18GX1.5 SHARP (NEEDLE)
NEEDLE SPNL 18GX3.5 QUINCKE PK (NEEDLE) ×3 IMPLANT
NS IRRIG 1000ML POUR BTL (IV SOLUTION) ×3 IMPLANT
PACK TOTAL JOINT (CUSTOM PROCEDURE TRAY) ×3 IMPLANT
PAD ABD 8X10 STRL (GAUZE/BANDAGES/DRESSINGS) ×3 IMPLANT
PAD ARMBOARD 7.5X6 YLW CONV (MISCELLANEOUS) ×6 IMPLANT
PADDING CAST COTTON 6X4 STRL (CAST SUPPLIES) ×3 IMPLANT
SET HNDPC FAN SPRY TIP SCT (DISPOSABLE) ×1 IMPLANT
SPONGE GAUZE 4X4 12PLY (GAUZE/BANDAGES/DRESSINGS) ×3 IMPLANT
STAPLER VISISTAT 35W (STAPLE) ×3 IMPLANT
SUCTION FRAZIER TIP 10 FR DISP (SUCTIONS) ×3 IMPLANT
SUT VIC AB 0 CTX 36 (SUTURE) ×2
SUT VIC AB 0 CTX36XBRD ANTBCTR (SUTURE) ×1 IMPLANT
SUT VIC AB 1 CTX 36 (SUTURE) ×2
SUT VIC AB 1 CTX36XBRD ANBCTR (SUTURE) ×1 IMPLANT
SUT VIC AB 2-0 CT1 27 (SUTURE) ×2
SUT VIC AB 2-0 CT1 TAPERPNT 27 (SUTURE) ×1 IMPLANT
SYR 30ML LL (SYRINGE) ×3 IMPLANT
SYR 50ML LL SCALE MARK (SYRINGE) ×3 IMPLANT
TOWEL OR 17X24 6PK STRL BLUE (TOWEL DISPOSABLE) ×3 IMPLANT
TOWEL OR 17X26 10 PK STRL BLUE (TOWEL DISPOSABLE) ×3 IMPLANT
WATER STERILE IRR 1000ML POUR (IV SOLUTION) IMPLANT

## 2013-09-19 NOTE — Plan of Care (Signed)
Problem: Consults Goal: Diagnosis- Total Joint Replacement Primary Total Knee Right     

## 2013-09-19 NOTE — Progress Notes (Signed)
Utilization review completed.  

## 2013-09-19 NOTE — Interval H&P Note (Signed)
History and Physical Interval Note:  09/19/2013 7:09 AM  Cheryl StandingMartha J Dwyer  has presented today for surgery, with the diagnosis of OSTEOARTHRITIS RIGHT KNEE  The various methods of treatment have been discussed with the patient and family. After consideration of risks, benefits and other options for treatment, the patient has consented to  Procedure(s): TOTAL KNEE ARTHROPLASTY (Right) as a surgical intervention .  The patient's history has been reviewed, patient examined, no change in status, stable for surgery.  I have reviewed the patient's chart and labs.  Questions were answered to the patient's satisfaction.     Nestor LewandowskyOWAN,Irelynn Schermerhorn J

## 2013-09-19 NOTE — Op Note (Signed)
PATIENT ID:      Cheryl [name]  MRN:     [id number] DOB/AGE:    [birth year] / 80 y.o.       OPERATIVE REPORT    DATE OF PROCEDURE:  09/19/2013       PREOPERATIVE DIAGNOSIS:   OSTEOARTHRITIS RIGHT KNEE      Estimated body mass index is 26.39 kg/(m^2) as calculated from the following:   Height as of 09/12/13: 5' 6.5" (1.689 m).   Weight as of this encounter: 75.297 kg (166 lb).                                                        POSTOPERATIVE DIAGNOSIS:   OA right knee                                                                      PROCEDURE:  Procedure(s): TOTAL KNEE ARTHROPLASTY Using Depuy Sigma RP implants #4R Femur, #4Tibia, 10mm sigma RP bearing, 35 Patella     SURGEON: Melessa Cowell J    ASSISTANT:   Eric K. [employer] Energy[employer]ill[employer] PA-C   (Present and scrubbed throughout the case, critical for assistance with exposure, retraction, instrumentation, and closure.)         ANESTHESIA: GET Eparel  DRAINS: foley, 2 medium hemovac in knee   TOURNIQUET TIME: 75min   COMPLICATIONS:  None     SPECIMENS: None   INDICATIONS FOR PROCEDURE: The patient has  OSTEOARTHRITIS RIGHT KNEE, varus deformities, XR shows bone on bone arthritis. Patient has failed all conservative measures including anti-inflammatory medicines, narcotics, attempts at  exercise and weight loss, cortisone injections and viscosupplementation.  Risks and benefits of surgery have been discussed, questions answered.   DESCRIPTION OF PROCEDURE: The patient identified by armband, received  IV antibiotics[facility] Patient taken to the operating room, appropriate anesthetic  monitors were attached, and general endotracheal anesthesia induced with  the patient in supine position, Foley catheter was inserted. Tourniquet  applied high to the operative thigh. Lateral post and foot positioner  applied to the table, the lower extremity was then prepped and draped  in usual sterile fashion from the ankle to  the tourniquet. Time-out procedure was performed. The limb was wrapped with an Esmarch bandage and the tourniquet inflated to 350 mmHg. We began the operation by making the anterior midline incision starting at handbreadth above the patella going over the patella 1 cm medial to and  4 cm distal to the tibial tubercle. Small bleeders in the skin and the  subcutaneous tissue identified and cauterized. Transverse retinaculum was incised and reflected medially and a medial parapatellar arthrotomy was accomplished. the patella was everted and theprepatellar fat pad resected. The superficial medial collateral  ligament was then elevated from anterior to posterior along the proximal  flare of the tibia and anterior half of the menisci resected. The knee was hyperflexed exposing bone on bone arthritis. Peripheral and notch osteophytes as well as the cruciate ligaments were then resected. We continued to  work our way around posteriorly along  the proximal tibia, and externally  rotated the tibia subluxing it out from underneath the femur. A McHale  retractor was placed through the notch and a lateral Hohmann retractor  placed, and we then drilled through the proximal tibia in line with the  axis of the tibia followed by an intramedullary guide rod and 2-degree  posterior slope cutting guide. The tibial cutting guide was pinned into place  allowing resection of 8 mm of bone medially and about 8 mm of bone  laterally because of her varus deformity. Satisfied with the tibial resection, we then  entered the distal femur 2 mm anterior to the PCL origin with the  intramedullary guide rod and applied the distal femoral cutting guide  set at 11mm, with 5 degrees of valgus. This was pinned along the  epicondylar axis. At this point, the distal femoral cut was accomplished without difficulty. We then sized for a #4R femoral component and pinned the guide in 0 degrees of external rotation.The chamfer cutting guide was  pinned into place. The anterior, posterior, and chamfer cuts were accomplished without difficulty followed by  the Sigma RP box cutting guide and the box cut. We also removed posterior osteophytes from the posterior femoral condyles. At this  time, the knee was brought into full extension. We checked our  extension and flexion gaps and found them symmetric at 10mm.  The patella thickness measured at 23 mm. We set the cutting guide at 14 and removed the posterior 10 mm  of the patella, sized for a 35 button and drilled the lollipop. The knee  was then once again hyperflexed exposing the proximal tibia. We sized for a #4 tibial base plate, applied the smokestack and the conical reamer followed by the the Delta fin keel punch. We then hammered into place the Sigma RP trial femoral component, inserted a 10-mm trial bearing, trial patellar button, and took the knee through range of motion from 0-130 degrees. No thumb pressure was required for patellar  tracking. At this point, all trial components were removed, a double batch of DePuy HV cement with 1500 mg of Zinacef was mixed and applied to all bony metallic mating surfaces except for the posterior condyles of the femur itself. In order, we  hammered into place the tibial tray and removed excess cement, the femoral component and removed excess cement, a 10-mm Sigma RP bearing  was inserted, and the knee brought to full extension with compression.  The patellar button was clamped into place, and excess cement  removed. While the cement cured the wound was irrigated out with normal saline solution pulse lavage, and medium Hemovac drains were placed from an anterolateral  approach. Ligament stability and patellar tracking were checked and found to be excellent. The parapatellar arthrotomy was closed with  running #1 Vicryl suture. The subcutaneous tissue with 0 and 2-0 undyed  Vicryl suture, and the skin with skin staples. A dressing of Xeroform,  4 x 4,  dressing sponges, Webril, and Ace wrap applied. The patient  awakened, extubated, and taken to recovery room without difficulty.   Leonda Cristo J 09/19/2013, 8:43 AM

## 2013-09-19 NOTE — Progress Notes (Signed)
Orthopedic Tech Progress Note Patient Details:  Cheryl StandingMartha J [name] 16-Nov-1926 [id number] CPM applied to Right LE with appropriate settings. OHF applied to bed.  CPM Right Knee CPM Right Knee: On Right Knee Flexion (Degrees): 60 Right Knee Extension (Degrees): 0   Asia R Thompson 09/19/2013, 10:46 AM

## 2013-09-19 NOTE — Progress Notes (Signed)
Duplicate orders discontinued  Due to having already performed them during pre admit visit.

## 2013-09-19 NOTE — Evaluation (Signed)
Physical Therapy Evaluation Patient Details Name: Cheryl [name] MRN: [id number] DOB: August 10, 1926 Today'[name] Date: 09/19/2013   History of Present Illness  80 y.o. female presenting [name]/p R TKA  Clinical Impression  Pt is [name]/p right TKA resulting in the deficits listed below (see PT Problem List). Pt with syncopal episode (approximately 20 seconds) shortly after transferring to chair. Nurse recorded vitals in vital section. Pt will benefit from skilled PT to increase their independence and safety with mobility to allow discharge to the venue listed below.     Follow Up Recommendations SNF    Equipment Recommendations  Rolling walker with 5" wheels    Recommendations for Other Services       Precautions / Restrictions Precautions Precautions: Knee Precaution Booklet Issued: No Precaution Comments: Reviewed verbally Restrictions Weight Bearing Restrictions: Yes RLE Weight Bearing: Weight bearing as tolerated      Mobility  Bed Mobility Overal bed mobility: Needs Assistance Bed Mobility: Supine to Sit     Supine to sit: Min assist     General bed mobility comments: requires assit for RLE out of bed and minimal assist for trunk control. Sits EOB without support.  Transfers Overall transfer level: Needs assistance Equipment used: Rolling walker (2 wheeled) Transfers: Stand Pivot Transfers   Stand pivot transfers: Min assist       General transfer comment: Min assist for stand pivot transfer for power up to stand and RW placement. Verbal cues for hand placement and sequencing. Pt reported lightheadedness and needed to sit quickly with poor control for descent. Pt shortly after had synocpal episode and was unresponsive for about 20 seconds. Blood pressure taken and recorded by nurse (noted under vitals).  Ambulation/Gait                Stairs            Wheelchair Mobility    Modified Rankin (Stroke Patients Only)       Balance                                             Pertinent Vitals/Pain Pain 6/10 Nurse notified Syncopal episode after transfer to chair (Nurse [facility] recorded vitals) Pt repositioned in chair for comfort; knee positioned for optimal extension.    Home Living Family/patient expects to be discharged to:: Skilled nursing facility Living Arrangements: Other relatives Medical laboratory scientific officer) Available Help at Discharge: Skilled Nursing Facility           Home Equipment: Dan Humphreys - 4 wheels;Shower seat;Wheelchair - manual      Prior Function Level of Independence: Needs assistance   Gait / Transfers Assistance Needed: Ambulate withou assistance requires rollator  ADL'[name] / Homemaking Assistance Needed: granddaughter would cook and clean for pt.        Hand Dominance   Dominant Hand: Right    Extremity/Trunk Assessment   Upper Extremity Assessment: Overall WFL for tasks assessed           Lower Extremity Assessment: RLE deficits/detail         Communication   Communication: No difficulties  Cognition Arousal/Alertness: Lethargic Behavior During Therapy: WFL for tasks assessed/performed Overall Cognitive Status: Within Functional Limits for tasks assessed                      General Comments      Exercises Total Joint Exercises Ankle  Circles/Pumps: AROM;10 reps;Seated;Both Quad Sets: AROM;Right;10 reps;Supine      Assessment/Plan    PT Assessment Patient needs continued PT services  PT Diagnosis Difficulty walking;Abnormality of gait;Acute pain   PT Problem List Decreased strength;Decreased range of motion;Decreased activity tolerance;Decreased balance;Decreased mobility;Decreased knowledge of use of DME;Decreased knowledge of precautions;Pain  PT Treatment Interventions DME instruction;Gait training;Functional mobility training;Therapeutic activities;Therapeutic exercise;Balance training;Neuromuscular re-education;Modalities   PT Goals (Current goals can be found in  the Care Plan section) Acute Rehab PT Goals Patient Stated Goal: Walk without a walker PT Goal Formulation: With patient Time For Goal Achievement: 09/26/13 Potential to Achieve Goals: Good    Frequency 7X/week   Barriers to discharge        Co-evaluation               End of Session Equipment Utilized During Treatment: Gait belt;Oxygen Activity Tolerance: Patient tolerated treatment well Patient left: in chair;with call bell/phone within reach Nurse Communication: Mobility status;Other (comment) (sycopal episode)         Time: 1428 (Seen by nursing for 10 minutes)-1515 PT Time Calculation (min): 47 min   Charges:   PT Evaluation $Initial PT Evaluation Tier I: 1 Procedure PT Treatments $Therapeutic Activity: 8-22 mins $Self Care/Home Management: 8-22   PT G Codes:         Cheryl [name], PT [phone]  Cheryl [name], Cheryl [name] 09/19/2013, 4:23 PM

## 2013-09-19 NOTE — Anesthesia Preprocedure Evaluation (Addendum)
Anesthesia Evaluation  Patient identified by MRN, date of birth, ID band Patient awake    Reviewed: Allergy & Precautions, H&P , NPO status , Patient's Chart, lab work & pertinent test results  Airway Mallampati: II  Neck ROM: full    Dental   Pulmonary former smoker,          Cardiovascular hypertension, + dysrhythmias Atrial Fibrillation + pacemaker     Neuro/Psych    GI/Hepatic   Endo/Other    Renal/GU Renal InsufficiencyRenal disease     Musculoskeletal  (+) Arthritis -,   Abdominal   Peds  Hematology   Anesthesia Other Findings   Reproductive/Obstetrics                          Anesthesia Physical Anesthesia Plan  ASA: III  Anesthesia Plan: General and Regional   Post-op Pain Management: MAC Combined w/ Regional for Post-op pain   Induction: Intravenous  Airway Management Planned: LMA  Additional Equipment:   Intra-op Plan:   Post-operative Plan: Extubation in OR  Informed Consent: I have reviewed the patients History and Physical, chart, labs and discussed the procedure including the risks, benefits and alternatives for the proposed anesthesia with the patient or authorized representative who has indicated his/her understanding and acceptance.     Plan Discussed with: CRNA, Anesthesiologist and Surgeon  Anesthesia Plan Comments:        Anesthesia Quick Evaluation

## 2013-09-19 NOTE — Transfer of Care (Signed)
Immediate Anesthesia Transfer of Care Note  Patient: Cheryl [name]  Procedure(s) Performed: Procedure(s): TOTAL KNEE ARTHROPLASTY (Right)  Patient Location: PACU  Anesthesia Type:General  Level of Consciousness: awake, oriented and patient cooperative  Airway & Oxygen Therapy: Patient Spontanous Breathing and Patient connected to nasal cannula oxygen  Post-op Assessment: Report given to PACU RN and Post -op Vital signs reviewed and stable  Post vital signs: Reviewed  Complications: No apparent anesthesia complications

## 2013-09-19 NOTE — Anesthesia Procedure Notes (Addendum)
Anesthesia Regional Block:  Adductor canal block  Pre-Anesthetic Checklist: ,, timeout performed, Correct Patient, Correct Site, Correct Laterality, Correct Procedure, Correct Position, site marked, Risks and benefits discussed,  Surgical consent,  Pre-op evaluation,  At surgeon's request and post-op pain management  Laterality: Right  Prep: Maximum Sterile Barrier Precautions used and chloraprep       Needles:  Injection technique: Single-shot  Needle Type: Echogenic Needle     Needle Length: 9cm 9 cm Needle Gauge: 21 and 21 G    Additional Needles:  Procedures: ultrasound guided (picture in chart) Adductor canal block Narrative:  Start time: 09/19/2013 7:03 AM End time: 09/19/2013 7:17 AM  Performed by: Personally  Anesthesiologist: Dr Chaney MallingHodierne  Additional Notes: Pt tolerated the procedure well.   Procedure Name: LMA Insertion Date/Time: 09/19/2013 7:42 AM Performed by: Romie MinusOCK, Ailyn Gladd K Pre-anesthesia Checklist: Patient identified, Emergency Drugs available, Suction available, Patient being monitored and Timeout performed Patient Re-evaluated:Patient Re-evaluated prior to inductionOxygen Delivery Method: Circle system utilized Preoxygenation: Pre-oxygenation with 100% oxygen Intubation Type: Combination inhalational/ intravenous induction Ventilation: Mask ventilation without difficulty LMA: LMA inserted LMA Size: 4.0 Number of attempts: 1 Placement Confirmation: positive ETCO2,  CO2 detector and breath sounds checked- equal and bilateral Tube secured with: Tape Dental Injury: Teeth and Oropharynx as per pre-operative assessment

## 2013-09-19 NOTE — Anesthesia Postprocedure Evaluation (Signed)
Anesthesia Post Note  Patient: Cheryl [name]  Procedure(s) Performed: Procedure(s) (LRB): TOTAL KNEE ARTHROPLASTY (Right)  Anesthesia type: General  Patient location: PACU  Post pain: Pain level controlled and Adequate analgesia  Post assessment: Post-op Vital signs reviewed, Patient's Cardiovascular Status Stable, Respiratory Function Stable, Patent Airway and Pain level controlled  Last Vitals:  Filed Vitals:   09/19/13 1018  BP: 164/68  Pulse: 61  Temp:   Resp: 9    Post vital signs: Reviewed and stable  Level of consciousness: awake, alert  and oriented  Complications: No apparent anesthesia complications

## 2013-09-20 LAB — CBC
HEMATOCRIT: 29.4 % — AB (ref 36.0–46.0)
Hemoglobin: 9.8 g/dL — ABNORMAL LOW (ref 12.0–15.0)
MCH: 32 pg (ref 26.0–34.0)
MCHC: 33.3 g/dL (ref 30.0–36.0)
MCV: 96.1 fL (ref 78.0–100.0)
Platelets: 204 10*3/uL (ref 150–400)
RBC: 3.06 MIL/uL — ABNORMAL LOW (ref 3.87–5.11)
RDW: 15.1 % (ref 11.5–15.5)
WBC: 11.8 10*3/uL — AB (ref 4.0–10.5)

## 2013-09-20 LAB — PROTIME-INR
INR: 1.16 (ref 0.00–1.49)
PROTHROMBIN TIME: 14.6 s (ref 11.6–15.2)

## 2013-09-20 NOTE — Care Management Note (Signed)
CARE MANAGEMENT NOTE 09/20/2013  Patient:  [name],Malaney J   Account Number:  [id number]  Date Initiated:  09/20/2013  Documentation initiated by:  Vance PeperBRADY,Macrae Wiegman  Subjective/Objective Assessment:   80 yr old female admitted with osteoarthritis, s/p right total knee arthroplasty.     Action/Plan:   [facility] Social Worker is aware.   Anticipated DC Date:  09/22/2013   Anticipated DC Plan:  SKILLED NURSING FACILITY  In-house referral  Clinical Social Worker      DC Planning Services  CM consult      [facility]  [facility]   Choice offered to / List presented to:     DME arranged  NA        HH arranged  NA      Status of service:  Completed, signed off Medicare Important Message given?   (If response is "NO", the following Medicare IM given date fields will be blank) Date Medicare IM given:   Date Additional Medicare IM given:    Discharge Disposition:  SKILLED NURSING FACILITY  Per UR Regulation:

## 2013-09-20 NOTE — Progress Notes (Signed)
Physical Therapy Treatment Patient Details Name: Cheryl [name] MRN: [id number] DOB: 1926/07/02 Today's Date: 09/20/2013    History of Present Illness 80 y.o. female presenting s/p R TKA    PT Comments    Pt requires a lot of cueing for all activities; Mod assist +2 for transfers and walking today may be due to pain and/or fatigue; R. Knee flexion increased to 65 degrees (from 60); Pt willing and able to participate with encouragement; R. Knee unstable and requires cueing/hand bracing with stance and swing phases of gait; Pt visibly shaking from nerves/concern about stability and improvement   Follow Up Recommendations  SNF     Equipment Recommendations  Rolling walker with 5" wheels (PT recommends 'Youth' walker)    Recommendations for Other Services       Precautions / Restrictions Precautions Precautions: Knee Precaution Comments: Reviewed verbally Restrictions Weight Bearing Restrictions: Yes RLE Weight Bearing: Weight bearing as tolerated    Mobility  Bed Mobility                  Transfers Overall transfer level: Needs assistance Equipment used: Rolling walker (2 wheeled) Transfers: Sit to/from Stand Sit to Stand: Mod assist         General transfer comment: Mod assist for transfers and walking; Pt required VC for hand placement and body mechanics; Poor control with descent  Ambulation/Gait Ambulation/Gait assistance: Mod assist Ambulation Distance (Feet): 2 Feet Assistive device: Rolling walker (2 wheeled) Gait Pattern/deviations: Step-to pattern;Decreased step length - left;Decreased step length - right;Decreased stance time - right;Decreased dorsiflexion - right;Decreased stride length;Decreased weight shift to right;Antalgic;Trunk flexed   Gait velocity interpretation: <1.8 ft/sec, indicative of risk for recurrent falls   Near constant cues for upright posture and gait sequence; required physical block of R knee in stance for stability; Very  painful, and decr ability to weight shift onto R LE; Provided pt with shorter RW for better ability to push down into it to [name] and help with L foot advancement  Stairs            Wheelchair Mobility    Modified Rankin (Stroke Patients Only)       Balance Overall balance assessment: Needs assistance (Pt can sit EOB modified independent) Sitting-balance support: Bilateral upper extremity supported       Standing balance support: Bilateral upper extremity supported                        Cognition Arousal/Alertness: Awake/alert Behavior During Therapy: Anxious (Pt visibly shaking from nerves) Overall Cognitive Status: Within Functional Limits for tasks assessed                      Exercises Total Joint Exercises Quad Sets: AROM;10 reps;Seated;Right (Limited by pain) Short Arc QuadBarbaraann [name]: AAROM;Right;10 reps;Seated (Limited by pain) Straight Leg Raises: AAROM;5 reps (PT assisted 60%, Pt doing 40%) Required extra time to ensure understanding of therex, especially quad setting   General Comments        Pertinent Vitals/Pain SpO2 : 92% at end of session R. Knee Pain : - 5/10 at beginning of session - 5/10 at end of session  Pt premedicated for pain prior to session; Pt left with heel elevated for optimal healing and ice on R. Knee    Home Living                      Prior Function  PT Goals (current goals can now be found in the care plan section) Acute Rehab PT Goals Patient Stated Goal: Walk without a walker; stop shaking Progress towards PT goals: Progressing toward goals    Frequency  7X/week    PT Plan Current plan remains appropriate    Co-evaluation             End of Session Equipment Utilized During Treatment: Gait belt Activity Tolerance: Patient tolerated treatment well;Patient limited by pain Patient left: in chair;with call bell/phone within reach;with family/visitor present     Time:  1610-9604 PT Time Calculation (min): 56 min  Charges:                       G CodesIris [name] 09/20/2013, 12:09 PM  Cheryl [name], PT  Acute Rehabilitation Services Pager [phone] Office [phone]

## 2013-09-20 NOTE — Clinical Social Work Placement (Deleted)
Clinical Social Work Department  CLINICAL SOCIAL WORK PLACEMENT NOTE  Patient: Ezzard StandingMartha J Farve Account Number: [id number] Admit date: 09/19/13  Clinical Social Worker: Sabino NiemannAmy Lallie Strahm LCSWA Date/time: 09/19/2013 11:30 AM  Clinical Social Work is seeking post-discharge placement for this patient at the following level of care: SKILLED NURSING (*CSW will update this form in Epic as items are completed)  09/19/2013 Patient/family provided with [facility] Department of Clinical Social Work's list of facilities offering this level of care within the geographic area requested by the patient (or if unable, by the patient's family).  09/19/2013 Patient/family informed of their freedom to choose among providers that offer the needed level of care, that participate in Medicare, Medicaid or managed care program needed by the patient, have an available bed and are willing to accept the patient.  4/6/2015Patient/family informed of [facility]' ownership interest in [facility], as well as of the fact that they are under no obligation to receive care at this facility.  PASARR submitted to EDS on Pre-exisitng PASARR number received from EDS on  FL2 transmitted to all facilities in geographic area requested by pt/family on 09/19/2013  FL2 transmitted to all facilities within larger geographic area on  Patient informed that his/her managed care company has contracts with or will negotiate with certain facilities, including the following:  Patient/family informed of bed offers received:  Patient chooses bed at  Physician recommends and patient chooses bed at  Patient to be transferred to on  Patient to be transferred to facility by  The following physician request were entered in Epic:  Additional Comments:

## 2013-09-20 NOTE — Clinical Social Work Psychosocial (Cosign Needed)
Clinical Social Work Department BRIEF PSYCHOSOCIAL ASSESSMENT 09/20/2013  Patient:  Cheryl [name], Cheryl [name]     Account Number:  [id number]     Admit date:  09/19/2013  Clinical Social Worker:  Donnella Sham, CLINICAL SOCIAL WORKER  Date/Time:  09/20/2013 01:56 PM  Referred by:  Physician  Date Referred:  09/20/2013 Referred for  SNF Placement   Other Referral:   Interview type:  Patient Other interview type:    PSYCHOSOCIAL DATA Living Status:  FAMILY Admitted from facility:   Level of care:   Primary support name:  Cheryl [name] Primary support relationship to patient:  CHILD, ADULT Degree of support available:   Strong - pts granddaughter    CURRENT CONCERNS Current Concerns  Post-Acute Placement   Other Concerns:    SOCIAL WORK ASSESSMENT / PLAN Student SW met with pt at bedside. Pt was alert and oriented. Pt stated to student SW that she pre-registered at [facility] and plans to return home  at d/c. Student SW confirmed d/c plans with Ivin Booty, Engineer, site at [place]. Pt stated that after d/c pt will return to granddaughters home Public house manager). Granddaughter is primary support and per pt is very helpful with ordering, receiving and dispensing medications, along with making doctors appointments. Pt agrees that going to rehab to get stronger is the safest plan for pt and pts family. FL2 is on chart for MDs signature.   Assessment/plan status:  Psychosocial Support/Ongoing Assessment of Needs Other assessment/ plan:   Information/referral to community resources:    PATIENT'S/FAMILY'S RESPONSE TO PLAN OF CARE: Student SW met with pt at bedside. Pt was alert and oriented. Pt stated to student SW that she has been set up at [facility] and plans to return home at d/c. Pt is very pleasant and thankful for Student SW's visit. She will handle her own business at the facility.  Patient was noted to be smiling and happy during today's  visit and denied any concerns or questions at this time.    Donnella Sham, [place] Intern  [phone]   09/20/13  I have read and reviewed above note completed by BSW Intern. Assessment approved.  Plan d/c to SNF at [facility] when medically stable.  Lorie Phenix. Perla, [place]

## 2013-09-20 NOTE — Clinical Social Work Psychosocial (Signed)
Clinical Social Work Department  BRIEF PSYCHOSOCIAL ASSESSMENT  Patient:Atavia Vicki MalletJ Sartin Account Number: [id number]   Admit date: 09/19/13 Clinical Social Worker Sabino NiemannAmy Ponciano Shealy, MSW Date/Time: 09/19/13 10:00 AM Referred by: Physician Date Referred: 09/20/2013 Referred for   SNF Placement   Other Referral:  Interview type: Patient's daughter approached CSW  Other interview type: PSYCHOSOCIAL DATA  Living Status: Family- currently. Family is planning to place patient in an ALF Admitted from facility:  Level of care:  Primary support name: Alverda SkeansGeorgiana Anderson Primary support relationship to patient: Daughter Degree of support available:  Strong and vested  CURRENT CONCERNS  Current Concerns   Post-Acute Placement   Other Concerns:  SOCIAL WORK ASSESSMENT / PLAN  Patient's daughter approached CSW to discuss placement for her mother. She reported that they are trying to get her placed in an ALF but would like her to get rehab at a SNF, specifically [facility], prior to going to the ALF. CSW spoke with the patient and she was agreeable to this  Pt lives with family currently but is going to be placed in an ALF  CSW explained placement process and answered questions.   [facility] is the family's preference. Patient has been there in the past  CSW completed FL2 and initiated SNF search.     Assessment/plan status: Information/[facility] to [facility]  Other assessment/ plan:  Information/referral to community resources:  SNF   PTAR  PATIENT'S/FAMILY'S RESPONSE TO PLAN OF CARE:  Patient and family were very pleasant and all at ease with the plan. They all verbalized their understanding of placement process and expressed their  appreciation for CSW assist.   Sabino Niemannmy Elmira Olkowski, MSW, LCSWA [phone]

## 2013-09-20 NOTE — Progress Notes (Signed)
Nutrition Brief Note  Patient identified on the Malnutrition Screening Tool (MST) Report  Per pt she lost weight after getting up to 200 lb by eating smaller portions. Pt eating well PTA, does report appetite was not great today (per documentation pt ate 75% of her meal)  Wt Readings from Last 15 Encounters:  09/19/13 166 lb (75.297 kg)  09/19/13 166 lb (75.297 kg)  09/12/13 166 lb 1.6 oz (75.342 kg)  06/02/13 176 lb (79.833 kg)  03/30/13 184 lb (83.462 kg)    Body mass index is 26.39 kg/(m^2). Patient meets criteria for overweight based on current BMI.   Current diet order is Heart Healthy, patient is consuming approximately 75% of meals at this time. Labs and medications reviewed.   No nutrition interventions warranted at this time. If nutrition issues arise, please consult RD.   Kendell BaneHeather Jibran Crookshanks RD, LDN, CNSC [phone] Pager [phone] After Hours Pager

## 2013-09-20 NOTE — Discharge Instructions (Signed)

## 2013-09-20 NOTE — Progress Notes (Signed)
Clinical Social Work Department CLINICAL SOCIAL WORK PLACEMENT NOTE 09/20/2013  Patient:  [name],Cheryl J  Account Number:  [id number] Admit date:  09/19/2013  Clinical Social Worker:  Judeth CornfieldSTEPHANIE MILES, CLINICAL SOCIAL WORKER  Date/time:  09/20/2013 03:24 PM  Clinical Social Work is seeking post-discharge placement for this patient at the following level of care:   SKILLED NURSING   (*CSW will update this form in Epic as items are completed)   09/20/2013  Patient/family provided with [facility] Department of Clinical Social Work's list of facilities offering this level of care within the geographic area requested by the patient (or if unable, by the patient's family).  09/13/2013  Patient/family informed of their freedom to choose among providers that offer the needed level of care, that participate in Medicare, Medicaid or managed care program needed by the patient, have an available bed and are willing to accept the patient.  09/20/2013  Patient/family informed of [facility]' ownership interest in [facility], as well as of the fact that they are under no obligation to receive care at this facility.  PASARR submitted to EDS on 09/20/2013 PASARR number received from EDS on 09/20/2013  FL2 transmitted to all facilities in geographic area requested by pt/family on  09/20/2013 FL2 transmitted to all facilities within larger geographic area on   Patient informed that his/her managed care company has contracts with or will negotiate with  certain facilities, including the following:   n/a     Patient/family informed of bed offers received:  09/20/2013 [facility] Physician [facility] and [facility]  [facility] on  09/22/2013 Patient to be transferred to facility by Ambulance  The following physician request were entered in Epic:   Additional Comments:

## 2013-09-20 NOTE — Progress Notes (Signed)
Patient ID: Cheryl [name], female   DOB: Sep 07, 1926, 80 y.o.   MRN: [id number] PATIENT ID: Cheryl StandingMartha J [name]  MRN: [id number]  DOB/AGE:  Sep 07, 1926 / 80 y.o.  1 Day Post-Op Procedure(s) (LRB): TOTAL KNEE ARTHROPLASTY (Right)    PROGRESS NOTE Subjective: Patient is alert, oriented, no Nausea, no Vomiting, yes passing gas, no Bowel Movement. Taking PO well. Denies SOB, Chest or Calf Pain. Using Incentive Spirometer, PAS in place. Ambulate weight bearing as tolerated today, CPM 0-60 Patient reports pain as 3 on 0-10 scale, patient had a near syncopal episode when she was sat in a chair he is today but has had no other issues appear pulses steady .    Objective: Vital signs in last 24 hours: Filed Vitals:   09/19/13 1600 09/19/13 2140 09/20/13 0251 09/20/13 0544  BP:  135/50 124/49 123/44  Pulse:  61 62 60  Temp:  98.2 F (36.8 C) 99.5 F (37.5 C) 99.1 F (37.3 C)  TempSrc:   Oral Oral  Resp: 18 18 18 18   Weight:      SpO2:  99% 97% 98%      Intake/Output from previous day: I/O last 3 completed shifts: In: 1450 [P.O.:150; I.V.:1300] Out: 775 [Urine:375; Drains:400]   Intake/Output this shift:     LABORATORY DATA:  Recent Labs  09/19/13 0608  WBC 5.1  HGB 13.0  HCT 38.9  PLT 242  INR 1.17    Examination: Neurologically intact ABD soft Neurovascular intact Sensation intact distally Intact pulses distally Dorsiflexion/Plantar flexion intact Incision: no drainage No cellulitis present Compartment soft} Blood and plasma separated in drain indicating minimal recent drainage, drain pulled without difficulty.  Assessment:   1 Day Post-Op Procedure(s) (LRB): TOTAL KNEE ARTHROPLASTY (Right) ADDITIONAL DIAGNOSIS:  Hypertension and Pacemaker for atrial flutter  Plan: PT/OT WBAT, CPM 5/hrs day until ROM 0-90 degrees, then D/C CPM DVT Prophylaxis:  SCDx72hrs, ASA 325 mg BID x 2 weeks DISCHARGE PLAN: Skilled Nursing Facility/Rehab DISCHARGE NEEDS: HHPT, HHRN, CPM,  Walker and 3-in-1 comode seat     Keiton Cosma J 09/20/2013, 7:40 AM

## 2013-09-20 NOTE — Progress Notes (Signed)
OT Cancellation Note  Patient Details Name: Ezzard StandingMartha J Bera MRN: [id number] DOB: 10/07/1926   Cancelled Treatment:    Reason Eval/Treat Not Completed: Other (comment) Pt is Medicare and current D/C plan is SNF. No apparent immediate acute care OT needs, therefore will defer OT to SNF. If OT eval is needed please call Acute Rehab Dept. at [phone] or text page OT at [phone].   Rae LipsMiller, Misti Towle M [phone] 09/20/2013, 8:22 AM

## 2013-09-21 ENCOUNTER — Encounter (HOSPITAL_COMMUNITY): Payer: Self-pay | Admitting: Orthopedic Surgery

## 2013-09-21 LAB — CBC
HEMATOCRIT: 28 % — AB (ref 36.0–46.0)
Hemoglobin: 9.4 g/dL — ABNORMAL LOW (ref 12.0–15.0)
MCH: 32.1 pg (ref 26.0–34.0)
MCHC: 33.6 g/dL (ref 30.0–36.0)
MCV: 95.6 fL (ref 78.0–100.0)
Platelets: 181 10*3/uL (ref 150–400)
RBC: 2.93 MIL/uL — ABNORMAL LOW (ref 3.87–5.11)
RDW: 14.9 % (ref 11.5–15.5)
WBC: 11 10*3/uL — AB (ref 4.0–10.5)

## 2013-09-21 LAB — PROTIME-INR
INR: 1.36 (ref 0.00–1.49)
PROTHROMBIN TIME: 16.4 s — AB (ref 11.6–15.2)

## 2013-09-21 MED ORDER — TIZANIDINE HCL 2 MG PO CAPS: 2.0000 mg | ORAL_CAPSULE | Freq: Three times a day (TID) | ORAL | Status: DC

## 2013-09-21 MED ORDER — WARFARIN SODIUM 2.5 MG PO TABS: 1.2500 mg | ORAL_TABLET | Freq: Every day | ORAL | Status: DC

## 2013-09-21 MED ORDER — OXYCODONE-ACETAMINOPHEN 5-325 MG PO TABS: 1.0000 | ORAL_TABLET | ORAL | Status: DC | PRN

## 2013-09-21 NOTE — Progress Notes (Signed)
Physical Therapy Treatment Patient Details Name: Cheryl StandingMartha J [name] MRN: [id number] DOB: November 27, 1926 Today's Date: 09/21/2013    History of Present Illness 80 y.o. female presenting s/p R TKA    PT Comments    Pt's gait, posture, and stability much improved from yesterday - Min Assist for gait and increased walking distance (8 ft); Pt in good spirits and very willing to participate; required frequent VC for posture and hand placement; decreased visible shaking and increased confidence in R. Knee stability; Knee flexion increased to 73 degrees; PT session terminated due to Pt feeling nauseous, possibly due to taking med's at beginning of session; PT recommends planning future sessions to be 30+ min after med distribution  Follow Up Recommendations  SNF     Equipment Recommendations  Rolling walker with 5" wheels (PT recommends "Youth' walker)    Recommendations for Other Services       Precautions / Restrictions Precautions Precautions: Knee Restrictions Weight Bearing Restrictions: Yes RLE Weight Bearing: Weight bearing as tolerated    Mobility  Bed Mobility                  Transfers Overall transfer level: Needs assistance Equipment used: Rolling walker (2 wheeled) Transfers: Sit to/from Stand Sit to Stand: Max assist;+2 physical assistance         General transfer comment: Mod assist for sit to/from [name]; VC for hand placement and body position throughout session  Ambulation/Gait Ambulation/Gait assistance: Min assist;+2 physical assistance (Min guard for balance; Assist w/progressing RLE for gait) Ambulation Distance (Feet): 8 Feet Assistive device: Rolling walker (2 wheeled) Gait Pattern/deviations: Step-to pattern;Decreased step length - right;Decreased step length - left;Decreased stance time - right;Decreased stride length;Decreased weight shift to right;Antalgic;Trunk flexed (Pt had one step-through progression during session)     General Gait  Details: Min Assist +2 for [name] and gait; Pt improved dramatically since yesterday; Pt requires VC and tactile cues for posture and body mechanics; Pt gaining confidence in R. knee stability during weight acceptance   Stairs            Wheelchair Mobility    Modified Rankin (Stroke Patients Only)       Balance Overall balance assessment: Needs assistance Sitting-balance support: Bilateral upper extremity supported       [name] balance support: Bilateral upper extremity supported     Single Leg Stance - Right Leg: 4 (4 SLS bouts of approx 2 sec duration (on RLE))                  Cognition Arousal/Alertness: Awake/alert Behavior During Therapy: Anxious (Decreased shaking from yesterday though) Overall Cognitive Status: Within Functional Limits for tasks assessed                      Exercises Total Joint Exercises Quad Sets: AROM;AAROM;Right;10 reps;Seated Heel Slides: AROM;AAROM;Strengthening;Right;10 reps;Seated Straight Leg Raises: AROM;AAROM;Strengthening;Right;10 reps;Seated Knee Flexion: AROM;AAROM;Right;Seated;Other reps (comment) (2 reps) Goniometric ROM: 73 degrees R knee flexion; R. knee ext not measured  Pt given verbal and tactile cues throughout exercises; PT providing 50% or less assistance    General Comments        Pertinent Vitals/Pain 4/10 at beginning of session 6/10 pain at end of session  Pt premedicated for pain; patient repositioned for comfort with ankle propped for optimal healing and ice on R. knee    Home Living  Prior Function            PT Goals (current goals can now be found in the care plan section) Acute Rehab PT Goals Patient Stated Goal: Walk without a walker; stop shaking Progress towards PT goals: Progressing toward goals    Frequency  7X/week    PT Plan Current plan remains appropriate    Co-evaluation             End of Session Equipment Utilized  During Treatment: Gait belt Activity Tolerance: Patient tolerated treatment well;Patient limited by pain;Other (comment) (Pt limited by nausea, possibly due to recent meds taken) Patient left: in chair;with call bell/phone within reach (Pt left with warm blanket around shoulder, R ankle elevated)     Time: 0865-7846 PT Time Calculation (min): 46 min  Charges:                       G Codes:      Iris Pert, SPT 09/21/2013, 4:07 PM  Van Clines, PT  Acute Rehabilitation Services Pager [phone] Office [phone]

## 2013-09-21 NOTE — Progress Notes (Addendum)
PATIENT ID: Cheryl StandingMartha J Kluender  MRN: [id number]  DOB/AGE:  April 26, 1927 / 80 y.o.  2 Days Post-Op Procedure(s) (LRB): TOTAL KNEE ARTHROPLASTY (Right)    PROGRESS NOTE Subjective: Patient is alert, oriented, no Nausea, no Vomiting, yes passing gas, no Bowel Movement. Taking PO well. Denies SOB, Chest or Calf Pain. Using Incentive Spirometer, PAS in place. Ambulate WBAT, CPM 0-65 Patient reports pain as 8 on 0-10 scale  .    Objective: Vital signs in last 24 hours: Filed Vitals:   09/20/13 1447 09/20/13 2036 09/21/13 0400 09/21/13 0648  BP: 144/47 135/36  133/48  Pulse: 59 82  81  Temp: 98.6 F (37 C) 99.8 F (37.7 C)  98.9 F (37.2 C)  TempSrc: Oral Oral  Oral  Resp: 18 18 18 18   Weight:      SpO2: 92% 96% 96% 94%      Intake/Output from previous day: I/O last 3 completed shifts: In: 720 [P.O.:720] Out: 1650 [Urine:1650]   Intake/Output this shift:     LABORATORY DATA:  Recent Labs  09/19/13 0608 09/20/13 0705  WBC 5.1 11.8*  HGB 13.0 9.8*  HCT 38.9 29.4*  PLT 242 204  INR 1.17 1.16    Examination: Neurologically intact Neurovascular intact Sensation intact distally Intact pulses distally Dorsiflexion/Plantar flexion intact Incision: dressing C/D/I No cellulitis present Compartment soft}  Assessment:   2 Days Post-Op Procedure(s) (LRB): TOTAL KNEE ARTHROPLASTY (Right) ADDITIONAL DIAGNOSIS:  Hypertension and Pacemaker for atrial flutter   Plan: PT/OT WBAT, CPM 5/hrs day until ROM 0-90 degrees, then D/C CPM DVT Prophylaxis:  SCDx72hrs, continue with pre-op coumadin DISCHARGE PLAN: Skilled Nursing Facility/Rehab  [facility] DISCHARGE NEEDS: HHPT, HHRN, CPM, Walker and 3-in-1 comode seat     Allena Katzric K Florie Carico 09/21/2013, 8:10 AM

## 2013-09-22 LAB — CBC
HEMATOCRIT: 22.5 % — AB (ref 36.0–46.0)
Hemoglobin: 7.6 g/dL — ABNORMAL LOW (ref 12.0–15.0)
MCH: 32.1 pg (ref 26.0–34.0)
MCHC: 33.8 g/dL (ref 30.0–36.0)
MCV: 94.9 fL (ref 78.0–100.0)
Platelets: 169 10*3/uL (ref 150–400)
RBC: 2.37 MIL/uL — ABNORMAL LOW (ref 3.87–5.11)
RDW: 15.1 % (ref 11.5–15.5)
WBC: 9.2 10*3/uL (ref 4.0–10.5)

## 2013-09-22 LAB — PROTIME-INR
INR: 1.42 (ref 0.00–1.49)
PROTHROMBIN TIME: 17 s — AB (ref 11.6–15.2)

## 2013-09-22 NOTE — Progress Notes (Signed)
Physical Therapy Treatment Patient Details Name: Cheryl [name] MRN: [id number] DOB: February 03, 1927 Today's Date: 09/22/2013    History of Present Illness 80 y.o. female presenting s/p R TKA    PT Comments    Pt stated she "doesn't have much confidence" but cont to show improvements with walking distance, stability, and strength; Mod Assist for sit-to-stand but Min Assist for gait and stand-to-sit transfer; Pt still requires frequent VC and tactile cues, particularly with gait sequence and when/how far to progress walker; Pt able to teach-back knee precautions; Pt cont to respond well to encouragement and was in good spirits; Pt d/c to SNF today  Follow Up Recommendations  SNF     Equipment Recommendations  Rolling walker with 5" wheels ('Youth' walker recommended)    Recommendations for Other Services       Precautions / Restrictions Precautions Precautions: Knee Precaution Comments: Reviewed verbally (Pt able to verbal teachback precautions and logic) Restrictions Weight Bearing Restrictions: Yes RLE Weight Bearing: Weight bearing as tolerated    Mobility  Bed Mobility                  Transfers Overall transfer level: Needs assistance Equipment used: Rolling walker (2 wheeled) Transfers: Sit to/from Stand Sit to Stand: Mod assist         General transfer comment: Mod assist for sit to stand; Min assist for stand to sit and gait; VC for hand placement, posture, and gait but decreased from previous sessions  Ambulation/Gait Ambulation/Gait assistance: Min assist (PT supervision for how far to push walker and step pattern) Ambulation Distance (Feet): 55 Feet Assistive device: Rolling walker (2 wheeled) Gait Pattern/deviations: Step-through pattern;Decreased step length - right;Decreased stride length;Decreased weight shift to right;Antalgic (Antalgic pattern decreased from previous sessions)     General Gait Details: Mod assist for sit to stand; Min assist  for stand to sit and gait; PT began gait part of session by palpating for quad activation and knee stability but once R. Knee was determined to be stable verbal cues were adequate; Pt required frequent VC for gait pattern, specifically with progressing walker   Stairs            Wheelchair Mobility    Modified Rankin (Stroke Patients Only)       Balance Overall balance assessment: Modified Independent Sitting-balance support: Single extremity supported       Standing balance support: Bilateral upper extremity supported     Single Leg Stance - Right Leg: 3 (3 bouts of SLS on R. leg, approx 2 sec duration each)                  Cognition Arousal/Alertness: Awake/alert Behavior During Therapy: WFL for tasks assessed/performed;Anxious (Pt still gaining confidence in R. knee) Overall Cognitive Status: Within Functional Limits for tasks assessed                      Exercises Total Joint Exercises Quad Sets: AROM;AAROM;Strengthening;Right;10 reps;Seated (PT provided approx 25% of effort) Heel Slides: AROM;AAROM;Strengthening;Right;10 reps;Seated (PT provided approx 25% of effort) Straight Leg Raises: AROM;AAROM;Right;10 reps;Seated (PT provided small boost at end of ROM) Goniometric ROM: 10-70 degrees (Pt reported stiffness from sitting w/R. ankle propped)    General Comments PT provided approx 25% of effort for quad set and heel slides; PT provided boost at end of ROM for SLR; Pt reported stiffness from sitting immediately prior to goniometer measurements      Pertinent Vitals/Pain 3/10 pain at beginning  of session 4/10 pain at end of session  SpO2 :: 99% at end of session (after a few min of sitting) HR :: 72 bpm at end of session (after a few min of sitting)  Pt premedicated for pain; patient repositioned for comfort and with R. Ankle propped for optimal healing    Home Living                      Prior Function            PT Goals  (current goals can now be found in the care plan section) Acute Rehab PT Goals Patient Stated Goal: Walk without a walker; stop shaking Progress towards PT goals: Progressing toward goals    Frequency       PT Plan Current plan remains appropriate (Pt discharged to SNF today)    Co-evaluation             End of Session Equipment Utilized During Treatment: Gait belt Activity Tolerance: Patient tolerated treatment well;Patient limited by fatigue Patient left: in chair;with call bell/phone within reach (Pt left with warm blanket on shoulders, R. ankle elevated)     Time: 1610-9604 PT Time Calculation (min): 39 min  Charges:  $Gait Training: 8-22 mins $Therapeutic Exercise: 8-22 mins $Therapeutic Activity: 8-22 mins                    G Codes:      Iris Pert, SPT 09/22/2013, 1:55 PM

## 2013-09-22 NOTE — Discharge Summary (Signed)
Patient ID: Cheryl [name] MRN: [id number] DOB/AGE: 1926-11-25 80 y.o.  Admit date: 09/19/2013 Discharge date: 09/22/2013  Admission Diagnoses:  Active Problems:   Arthritis of right knee   Discharge Diagnoses:  Same  Past Medical History  Diagnosis Date  . Atrial fibrillation     Prior cardioversion 10/11  . Chronic anticoagulation   . Hyperlipidemia   . Hypertension   . Pacemaker     [employer]tronic-10/13 implantation  . CKD (chronic kidney disease), stage III   . Anemia   . Dysrhythmia   . Pneumonia     "several times in my old age" (09/19/2013)  . History of blood transfusion     "related to OR" (09/19/2013)  . Osteoarthritis     "qwhere" (09/19/2013)  . Chronic right hip pain     Surgeries: Procedure(s): R TOTAL KNEE ARTHROPLASTY on 09/19/2013   Consultants:    Discharged Condition: Improved  Hospital Course: Cheryl [name] is an 80 y.o. female who was admitted 09/19/2013 for operative treatment of R knee DJD. Patient has severe unremitting pain that affects sleep, daily activities, and work/hobbies. After pre-op clearance the patient was taken to the operating room on 09/19/2013 and underwent  Procedure(s): TOTAL KNEE ARTHROPLASTY.    Patient was given perioperative antibiotics: Anti-infectives   Start     Dose/Rate Route Frequency Ordered Stop   09/19/13 0813  cefUROXime (ZINACEF) injection  Status:  Discontinued       As needed 09/19/13 0814 09/19/13 0925   09/19/13 0600  ceFAZolin (ANCEF) IVPB 2 g/50 mL premix     2 g 100 mL/hr over 30 Minutes Intravenous On call to O.R. 09/18/13 1431 09/19/13 0730       Patient was given sequential compression devices, early ambulation, and chemoprophylaxis to prevent DVT.  Patient benefited maximally from hospital stay and there were no complications.    Recent vital signs: Patient Vitals for the past 24 hrs:  BP Temp Temp src Pulse Resp SpO2  09/22/13 0517 122/41 mmHg 99 F (37.2 C) Oral 73 18 90 %  09/21/13 2152 132/43  mmHg 98.6 F (37 C) Oral 78 18 96 %  09/21/13 1300 153/40 mmHg 99.3 F (37.4 C) - 86 18 100 %     Recent laboratory studies:  Recent Labs  09/20/13 0705 09/21/13 0845  WBC 11.8* 11.0*  HGB 9.8* 9.4*  HCT 29.4* 28.0*  PLT 204 181  INR 1.16 1.36     Discharge Medications:     Medication List    STOP taking these medications       enoxaparin 80 MG/0.8ML injection  Commonly known as:  LOVENOX      TAKE these medications       acetaminophen 500 MG tablet  Commonly known as:  TYLENOL  Take 500 mg by mouth every 6 (six) hours as needed for mild pain.     amiodarone 200 MG tablet  Commonly known as:  PACERONE  Take 1 tablet (200 mg total) by mouth daily.     atorvastatin 20 MG tablet  Commonly known as:  LIPITOR  Take 20 mg by mouth daily.     fish oil-omega-3 fatty acids 1000 MG capsule  Take 1 g by mouth daily.     furosemide 40 MG tablet  Commonly known as:  LASIX  Take 1 tablet (40 mg total) by mouth 2 (two) times daily.     oxyCODONE-acetaminophen 5-325 MG per tablet  Commonly known as:  ROXICET  Take 1  tablet by mouth every 4 (four) hours as needed.     potassium chloride SA 20 MEQ tablet  Commonly known as:  K-DUR,KLOR-CON  Take 20 mEq by mouth daily.     prednisoLONE acetate 1 % ophthalmic suspension  Commonly known as:  PRED FORTE  Place 1 drop into both eyes 3 (three) times daily.     PROLENSA 0.07 % Soln  Generic drug:  Bromfenac Sodium  Place 1 drop into the left eye daily.     tizanidine 2 MG capsule  Commonly known as:  ZANAFLEX  Take 1 capsule (2 mg total) by mouth 3 (three) times daily.     traMADol 50 MG tablet  Commonly known as:  ULTRAM  Take 50 mg by mouth every 6 (six) hours as needed for moderate pain.     warfarin 2.5 MG tablet  Commonly known as:  COUMADIN  Take 0.5-1 tablets (1.25-2.5 mg total) by mouth daily. Take 1/2 tablet by mouth on Mon, Wed, and Fri. Take 1 tablet by mouth all other days        Diagnostic Studies:  Dg Chest 2 View  09/12/2013   CLINICAL DATA:  Preop for knee replacement.  EXAM: CHEST  2 VIEW  COMPARISON:  04/17/2013  FINDINGS: Heart size is upper limits normal. There is perihilar bronchitic change. Left-sided transvenous pacemaker leads overlie the right atrium and right ventricle.  At the left lung base, there is soft tissue density possibly representing and eventration of the diaphragm. However, further evaluation with chest CT is recommended to exclude a lung mass. There are no focal consolidations or definite pleural effusions. Degenerative changes are seen in the spine.  IMPRESSION: 1. Question of left lung base mass. 2. Further evaluation is recommended. CT of the chest with contrast is recommended. 3. Bronchitic changes without focal pulmonary infection. 4. These results will be called to the ordering clinician or representative by the Radiologist Assistant, and communication documented in the PACS Dashboard.   Electronically Signed   By: Rosalie Gums M.D.   On: 09/12/2013 11:42   Ct Chest W Contrast  09/15/2013   CLINICAL DATA:  Possible mass left lung base on recent chest x-ray  EXAM: CT CHEST WITH CONTRAST  TECHNIQUE: Multidetector CT imaging of the chest was performed during intravenous contrast administration.  CONTRAST:  75 cc Omnipaque 300  COMPARISON:  Chest x-ray 09/12/2013.  FINDINGS: Sagittal images of the spine shows osteopenia and degenerative changes thoracolumbar spine. Coronal images shows dextroscoliosis of thoracic spine.  Dual lead cardiac pacemaker in place noted with leads in right atrium and right ventricle.  Images of the thoracic inlet are unremarkable. Central airways are patent. Atherosclerotic calcifications of thoracic aorta.  There is trace pericardial effusion or thickening. Heart size within normal limits. Atherosclerotic calcifications of coronary arteries are noted.  No mediastinal hematoma or adenopathy. No hilar or axillary adenopathy.  The visualized upper abdomen  shows a cyst in left hepatic lobe measures 3 cm. Tiny gallstones or sludge noted within visualized gallbladder.  In sagittal image 102 there is eventration of the left posterior diaphragm containing fat measures 3 x 2.3 cm. There is no evidence of acute complication. This corresponds to chest x-ray abnormality. There is no evidence of pleural based mass at the left lung base. This is best visualized in coronal image 92.  Images of the lung parenchyma shows no acute infiltrate or pulmonary edema. No focal consolidation. There is no diagnostic pneumothorax.  IMPRESSION: 1. There is nodular  eventration of left posterior medial diaphragm measures 3 x 2.3 cm containing fat. This corresponds to chest x-ray abnormality. No pleural based mass is noted. 2. No acute infiltrate or pulmonary edema. 3. There is dextroscoliosis and degenerative changes of thoracic spine are noted. 4. Dual lead cardiac pacemaker in place. 5. No mediastinal hematoma or adenopathy. 6. Left hepatic lobe cyst measures 3 cm.   Electronically Signed   By: Natasha Mead M.D.   On: 09/15/2013 16:23    Disposition: 01-Home or Self Care      Discharge Orders   Future Appointments Provider Department Dept Phone   09/28/2013 10:15 AM [facility] [place] Office [phone]   10/07/2013 11:30 AM Hillis Range, MD [facility] [phone]   Future Orders Complete By Expires   Call MD / Call 911  As directed    Change dressing  As directed    Change dressing  As directed    Change dressing  As directed    Constipation Prevention  As directed    CPM  As directed    Diet - low sodium heart healthy  As directed    Discharge wound care:  As directed    DO NOT drive, shower or take a tub bath until instructed by your physician  As directed    Do not put a pillow under the knee. Place it under the heel.  As directed    Do not sit on low chairs, stoools or toilet seats, as it may be difficult to get up  from low surfaces  As directed    Driving restrictions  As directed    Follow the hip precautions as taught in Physical Therapy  As directed    Increase activity slowly as tolerated  As directed    Patient may shower  As directed       Follow-up Information   Follow up with Nestor Lewandowsky, MD In 2 weeks.   Specialty:  Orthopedic Surgery   Contact information:   [address] [place] [address] [phone]        Signed: Nestor Lewandowsky 09/22/2013, 8:10 AM

## 2013-09-22 NOTE — Progress Notes (Signed)
Reviewed and agree,   Van ClinesHolly Sameer Teeple, PT  Acute Rehabilitation Services Pager [phone] Office [phone]

## 2013-09-22 NOTE — Clinical Social Work Placement (Signed)
     Clinical Social Work Department CLINICAL SOCIAL WORK PLACEMENT NOTE 09/22/2013  Patient:  [name],Cheryl J  Account Number:  [id number] Admit date:  09/19/2013  Clinical Social Worker:  Judeth CornfieldSTEPHANIE MILES, CLINICAL SOCIAL WORKER  Date/time:  09/20/2013 03:24 PM  Clinical Social Work is seeking post-discharge placement for this patient at the following level of care:   SKILLED NURSING   (*CSW will update this form in Epic as items are completed)   09/20/2013  Patient/family provided with [facility] Department of Clinical Social Works list of facilities offering this level of care within the geographic area requested by the patient (or if unable, by the patients family).  09/13/2013  Patient/family informed of their freedom to choose among providers that offer the needed level of care, that participate in Medicare, Medicaid or managed care program needed by the patient, have an available bed and are willing to accept the patient.  09/20/2013  Patient/family informed of [facility] ownership interest in [facility], as well as of the fact that they are under no obligation to receive care at this facility.  PASARR submitted to EDS on 09/20/2013 PASARR number received from EDS on 09/20/2013  FL2 transmitted to all facilities in geographic area requested by pt/family on  09/20/2013 FL2 transmitted to all facilities within larger geographic area on   Patient informed that his/her managed care company has contracts with or will negotiate with  certain facilities, including the following:   n/a     Patient/family informed of bed offers received:  09/20/2013 [facility]ician [facility] and [facility]  [facility] be [facility]  09/22/2013 Patient to be transferred to facility by Ambulance  Sharin Mons(PTAR)  The following physician request were entered in Epic:   Additional Comments: 09/22/13  ok per MD for  d/c today to SNF.  Amy Stuckey, LCSWA completed d/c arrangements. No further CSW needs identiifed.  CSW signing off.  Lorri Frederickonna T. Winta Barcelo, LCSWA  [phone]

## 2013-09-22 NOTE — Progress Notes (Signed)
Patient ID: Ezzard StandingMartha J Cagley, female   DOB: Feb 13, 1927, 80 y.o.   MRN: [id number] PATIENT ID: Ezzard StandingMartha J Holten  MRN: [id number]  DOB/AGE:  Feb 13, 1927 / 80 y.o.  3 Days Post-Op Procedure(s) (LRB): TOTAL KNEE ARTHROPLASTY (Right)    PROGRESS NOTE Subjective: Patient is alert, oriented, no Nausea, no Vomiting, yes passing gas, no Bowel Movement. Taking PO well. Denies SOB, Chest or Calf Pain. Using Incentive Spirometer, PAS in place. Ambulate 8 feet WBAT, CPM 0-80 Patient reports pain as 5 on 0-10 scale  .    Objective: Vital signs in last 24 hours: Filed Vitals:   09/21/13 0648 09/21/13 1300 09/21/13 2152 09/22/13 0517  BP: 133/48 153/40 132/43 122/41  Pulse: 81 86 78 73  Temp: 98.9 F (37.2 C) 99.3 F (37.4 C) 98.6 F (37 C) 99 F (37.2 C)  TempSrc: Oral  Oral Oral  Resp: 18 18 18 18   Weight:      SpO2: 94% 100% 96% 90%      Intake/Output from previous day: I/O last 3 completed shifts: In: 600 [P.O.:600] Out: 650 [Urine:650]   Intake/Output this shift:     LABORATORY DATA:  Recent Labs  09/20/13 0705 09/21/13 0845  WBC 11.8* 11.0*  HGB 9.8* 9.4*  HCT 29.4* 28.0*  PLT 204 181  INR 1.16 1.36    Examination: Neurologically intact ABD soft Neurovascular intact Sensation intact distally Intact pulses distally Dorsiflexion/Plantar flexion intact Incision: no drainage No cellulitis present Compartment soft}  Assessment:   3 Days Post-Op Procedure(s) (LRB): TOTAL KNEE ARTHROPLASTY (Right) ADDITIONAL DIAGNOSIS:  Hypertension and afib  Plan: PT/OT WBAT, CPM 5/hrs day until ROM 0-90 degrees, then D/C CPM DVT Prophylaxis:  SCDx72hrs, ASA 325 mg BID x 2 weeks DISCHARGE PLAN: Skilled Nursing Facility/Rehab, today DISCHARGE NEEDS: HHPT, HHRN, CPM, Walker and 3-in-1 comode seat     Nestor LewandowskyFrank J Rainbow Salman 09/22/2013, 8:00 AM

## 2013-09-22 NOTE — Progress Notes (Signed)
Clinical social worker assisted with patient discharge to skilled nursing facility, [facility].  CSW addressed all family questions and concerns. CSW copied chart and added all important documents. CSW also set up patient transportation with [facility]. Clinical Social Worker will sign off for now as social work intervention is no longer needed.   Marney Treloar, MSW, LCSWA [phone] 

## 2013-09-23 ENCOUNTER — Other Ambulatory Visit: Payer: Self-pay

## 2013-09-23 ENCOUNTER — Encounter: Payer: Self-pay | Admitting: *Deleted

## 2013-09-23 LAB — CBC WITH DIFFERENTIAL/PLATELET
BASOS PCT: 0.4 %
Basophil #: 0 10*3/uL (ref 0.0–0.1)
EOS ABS: 0.1 10*3/uL (ref 0.0–0.7)
EOS PCT: 0.8 %
HCT: 25.8 % — ABNORMAL LOW (ref 35.0–47.0)
HGB: 8.4 g/dL — AB (ref 12.0–16.0)
LYMPHS ABS: 1.1 10*3/uL (ref 1.0–3.6)
Lymphocyte %: 12.4 %
MCH: 31.7 pg (ref 26.0–34.0)
MCHC: 32.8 g/dL (ref 32.0–36.0)
MCV: 97 fL (ref 80–100)
Monocyte #: 0.8 x10 3/mm (ref 0.2–0.9)
Monocyte %: 9.1 %
NEUTROS ABS: 6.9 10*3/uL — AB (ref 1.4–6.5)
Neutrophil %: 77.3 %
PLATELETS: 217 10*3/uL (ref 150–440)
RBC: 2.67 10*6/uL — AB (ref 3.80–5.20)
RDW: 14.6 % — ABNORMAL HIGH (ref 11.5–14.5)
WBC: 8.9 10*3/uL (ref 3.6–11.0)

## 2013-09-23 LAB — BASIC METABOLIC PANEL
Anion Gap: 1 — ABNORMAL LOW (ref 7–16)
BUN: 22 mg/dL — ABNORMAL HIGH (ref 7–18)
CALCIUM: 8.6 mg/dL (ref 8.5–10.1)
Chloride: 105 mmol/L (ref 98–107)
Co2: 31 mmol/L (ref 21–32)
Creatinine: 1.23 mg/dL (ref 0.60–1.30)
EGFR (African American): 46 — ABNORMAL LOW
GFR CALC NON AF AMER: 40 — AB
Glucose: 112 mg/dL — ABNORMAL HIGH (ref 65–99)
Osmolality: 278 (ref 275–301)
POTASSIUM: 3.9 mmol/L (ref 3.5–5.1)
SODIUM: 137 mmol/L (ref 136–145)

## 2013-09-26 ENCOUNTER — Non-Acute Institutional Stay (SKILLED_NURSING_FACILITY): Payer: Medicare Other | Admitting: Adult Health

## 2013-09-26 ENCOUNTER — Encounter: Payer: Self-pay | Admitting: Adult Health

## 2013-09-26 DIAGNOSIS — I4891 Unspecified atrial fibrillation: Secondary | ICD-10-CM

## 2013-09-26 DIAGNOSIS — D649 Anemia, unspecified: Secondary | ICD-10-CM

## 2013-09-26 DIAGNOSIS — M1711 Unilateral primary osteoarthritis, right knee: Secondary | ICD-10-CM

## 2013-09-26 DIAGNOSIS — M1710 Unilateral primary osteoarthritis, unspecified knee: Secondary | ICD-10-CM

## 2013-09-26 DIAGNOSIS — E785 Hyperlipidemia, unspecified: Secondary | ICD-10-CM

## 2013-09-26 DIAGNOSIS — IMO0002 Reserved for concepts with insufficient information to code with codable children: Secondary | ICD-10-CM

## 2013-09-26 NOTE — Progress Notes (Signed)
Patient ID: Cheryl [name], female   DOB: September 08, 1926, 80 y.o.   MRN: [id number]               PROGRESS NOTE  DATE: 09/26/2013  FAC[facility]  LEVEL OF CARE: SNF (31)  Acute Visit  CHIEF COMPLAINT:  Follow-up hospitalization  HISTORY OF PRESENT ILLNESS: This is an 80 year old female who [facility] with Arthritis S/P right total knee arthroplasty. She has been admitted for a short-term rehabilitation.  REASSESSMENT OF ONGOING PROBLEM(S):  ATRIAL FIBRILLATION: the patients atrial fibrillation remains stable.  The patient denies DOE, tachycardia, orthopnea, transient neurological sx, pedal edema, palpitations, & PNDs.  No complications noted from the medications currently being used.  HYPERLIPIDEMIA: No complications from the medications presently being used.   ANEMIA: The anemia has been stable. The patient denies fatigue, melena or hematochezia. No complications from the medications currently being used. 4/15 hgb 9.2  PAST MEDICAL HISTORY : Reviewed.  No changes.  [facility] MEDICATIONS: [facility]  REVIEW OF SYSTEMS:  GENERAL: no change in appetite, no fatigue, no weight changes, no fever, chills or weakness RESPIRATORY: no cough, SOB, DOE, wheezing, hemoptysis CARDIAC: no chest pain, or palpitations, +edema GI: no abdominal pain, diarrhea, constipation, heart burn, nausea or vomiting  PHYSICAL EXAMINATION  GENERAL: no acute distress, normal body habitus EYES: conjunctivae normal, sclerae normal, normal eye lids NECK: supple, trachea midline, no neck masses, no thyroid tenderness, no thyromegaly LYMPHATICS: no LAN in the neck, no supraclavicular LAN RESPIRATORY: breathing is even & unlabored, BS CTAB CARDIAC: RRR, no murmur,no extra heart sounds, RLE edema 2+, + pacemaker GI: abdomen soft, normal BS, no masses, no tenderness, no hepatomegaly, no  splenomegaly PSYCHIATRIC: the patient is alert & oriented to person, affect & behavior appropriate  LABS/RADIOLOGY: Labs reviewed: Basic Metabolic Panel:  Recent Labs  40/98/1110/15/14 1108 04/17/13 2004 09/12/13 1114  NA 142 140 143  K 4.1 4.0 3.9  CL 101 102 102  CO2 33* 28 28  GLUCOSE 86 118* 101*  BUN 26* 25* 22  CREATININE 1.2 1.40* 1.01  CALCIUM 9.4 9.9 9.3   Liver Function Tests:  Recent Labs  04/17/13 2004  AST 26  ALT 19  ALKPHOS 121*  BILITOT 0.8  PROT 7.8  ALBUMIN 3.3*   CBC:  Recent Labs  03/30/13 1108 04/17/13 2004  09/19/13 0608 09/20/13 0705 09/21/13 0845 09/22/13 0633  WBC 8.0 15.3*  < > 5.1 11.8* 11.0* 9.2  NEUTROABS 5.9 13.4*  --  3.0  --   --   --   HGB 14.4 13.4  < > 13.0 9.8* 9.4* 7.6*  HCT 43.4 40.5  < > 38.9 29.4* 28.0* 22.5*  MCV 91.2 91.4  < > 95.3 96.1 95.6 94.9  PLT 256.0 281  < > 242 204 181 169  < > = values in this interval not displayed.    ASSESSMENT/PLAN:  Arthritis S/P right total knee arthroplasty - for rehabilitation Atrial Fibrillation - rate-controlled; continue Amiodarone and Coumadin Hyperlipidemia - continue Lipitor and Fish Oil Anemia - stable  CPT CODE: [id number]  Ella BodoMonina Vargas - NP [facility] [phone]

## 2013-09-29 ENCOUNTER — Non-Acute Institutional Stay (SKILLED_NURSING_FACILITY): Payer: Medicare Other | Admitting: Internal Medicine

## 2013-09-29 DIAGNOSIS — E785 Hyperlipidemia, unspecified: Secondary | ICD-10-CM

## 2013-09-29 DIAGNOSIS — Z96659 Presence of unspecified artificial knee joint: Secondary | ICD-10-CM

## 2013-09-29 DIAGNOSIS — I4891 Unspecified atrial fibrillation: Secondary | ICD-10-CM

## 2013-09-29 DIAGNOSIS — I10 Essential (primary) hypertension: Secondary | ICD-10-CM

## 2013-09-29 DIAGNOSIS — D649 Anemia, unspecified: Secondary | ICD-10-CM

## 2013-10-01 ENCOUNTER — Encounter: Payer: Self-pay | Admitting: Internal Medicine

## 2013-10-07 ENCOUNTER — Encounter: Payer: Self-pay | Admitting: Internal Medicine

## 2013-10-07 ENCOUNTER — Ambulatory Visit (INDEPENDENT_AMBULATORY_CARE_PROVIDER_SITE_OTHER): Payer: Medicare Other | Admitting: Internal Medicine

## 2013-10-07 VITALS — BP 154/62 | HR 60 | Ht 66.0 in | Wt 163.0 lb

## 2013-10-07 DIAGNOSIS — I495 Sick sinus syndrome: Secondary | ICD-10-CM

## 2013-10-07 DIAGNOSIS — I4891 Unspecified atrial fibrillation: Secondary | ICD-10-CM

## 2013-10-07 DIAGNOSIS — Z7901 Long term (current) use of anticoagulants: Secondary | ICD-10-CM

## 2013-10-07 DIAGNOSIS — Z950 Presence of cardiac pacemaker: Secondary | ICD-10-CM

## 2013-10-07 DIAGNOSIS — I10 Essential (primary) hypertension: Secondary | ICD-10-CM

## 2013-10-07 LAB — MDC_IDC_ENUM_SESS_TYPE_INCLINIC
Battery Voltage: 3 V
Brady Statistic AP VP Percent: 11.44 %
Brady Statistic AP VS Percent: 34.46 %
Brady Statistic AS VP Percent: 2.09 %
Brady Statistic AS VS Percent: 52.01 %
Date Time Interrogation Session: 20150424131818
Lead Channel Impedance Value: 504 Ohm
Lead Channel Pacing Threshold Amplitude: 1 V
Lead Channel Pacing Threshold Amplitude: 1 V
Lead Channel Pacing Threshold Pulse Width: 0.5 ms
Lead Channel Sensing Intrinsic Amplitude: 0.4 mV
Lead Channel Sensing Intrinsic Amplitude: 19.4 mV
Lead Channel Setting Pacing Amplitude: 2.5 V
Lead Channel Setting Pacing Pulse Width: 0.5 ms
MDC IDC MSMT LEADCHNL RA IMPEDANCE VALUE: 504 Ohm
MDC IDC MSMT LEADCHNL RV PACING THRESHOLD PULSEWIDTH: 0.5 ms
MDC IDC SET LEADCHNL RA PACING AMPLITUDE: 2 V
MDC IDC SET LEADCHNL RV SENSING SENSITIVITY: 2.1 mV
MDC IDC SET ZONE DETECTION INTERVAL: 400 ms
MDC IDC SET ZONE DETECTION INTERVAL: 400 ms
MDC IDC STAT BRADY RA PERCENT PACED: 45.9 %
MDC IDC STAT BRADY RV PERCENT PACED: 13.53 %

## 2013-10-07 NOTE — Patient Instructions (Signed)
Your physician recommends that you continue on your current medications as directed. Please refer to the Current Medication list given to you today.  Your physician wants you to follow-up in: 1 year with Brooke Edmisten, PAC. You will receive a reminder letter in the mail two months in advance. If you don't receive a letter, please call our office to schedule the follow-up appointment.  

## 2013-10-08 ENCOUNTER — Encounter: Payer: Self-pay | Admitting: Internal Medicine

## 2013-10-08 DIAGNOSIS — Z96659 Presence of unspecified artificial knee joint: Secondary | ICD-10-CM | POA: Insufficient documentation

## 2013-10-08 NOTE — Assessment & Plan Note (Signed)
Continue lipitor and omega 3

## 2013-10-08 NOTE — Assessment & Plan Note (Signed)
Post -op; do not see pt had transfusion;pt asymptomatic;will repeat CBC

## 2013-10-08 NOTE — Assessment & Plan Note (Signed)
Continue lasix  °

## 2013-10-08 NOTE — Assessment & Plan Note (Signed)
Oxycodone,zanaflex for pain and warfarin as DVT prophylaxis;admitted for OT/PT

## 2013-10-08 NOTE — Progress Notes (Signed)
MRN: [id number] Name: Cheryl [name]  Sex: female Age: 80 y.o. DOB: 01-01-1927  PSC #: [facility] Facility/Room:605 Level Of Care: SNF Provider: Margit HanksAnne D Dushawn Pusey Emergency Contacts: Extended Emergency Contact Information Primary Emergency Contact: Anderson,Shane Address: [address]          [place], [place] [address] [place] Home Phone: [phone] Mobile Phone: [phone] Relation: Grandson Secondary Emergency Contact: Doug SouAnderson,Georgiana "Georgie" Address: [address]          [place], [place] [address] [place] Home Phone: [phone] Mobile Phone: [phone] Relation: Grandaughter  Code Status: DNR  Allergies: Demerol  Chief Complaint  Patient presents with  . nursing home admission    HPI: Patient is 80 y.o. female who is admitted after total R knee arthroplasty for OT/PT.  Past Medical History  Diagnosis Date  . Atrial fibrillation     Prior cardioversion 10/11  . Chronic anticoagulation   . Hyperlipidemia   . Hypertension   . Pacemaker     Medtronic-10/13 implantation  . CKD (chronic kidney disease), stage III   . Anemia   . Dysrhythmia   . Pneumonia     "several times in my old age" (09/19/2013)  . History of blood transfusion     "related to OR" (09/19/2013)  . Osteoarthritis     "qwhere" (09/19/2013)  . Chronic right hip pain   . Tachy-brady syndrome     Past Surgical History  Procedure Laterality Date  . Tonsillectomy    . Abdominal hysterectomy    . Appendectomy    . Cardioversion N/A 04/04/2013    Procedure: CARDIOVERSION;  Surgeon: Donato SchultzMark Skains, MD;  [facility];  Service: Cardiovascular;  Laterality: N/A;  . Total knee arthroplasty Right 09/19/2013  . Hernia repair      "navel"  . Umbilical hernia repair      "I don't have a belly button"  . Cataract extraction w/ intraocular lens  implant, bilateral Bilateral   . Cardioversion  03/2010    /hx medical noted above  . Dilation and curettage of uterus       "had 3 miscarriages"   . Total knee arthroplasty Right 09/19/2013    Procedure: TOTAL KNEE ARTHROPLASTY;  Surgeon: Nestor LewandowskyFrank J Rowan, MD;  Location: [facility] OR;  Service: Orthopedics;  Laterality: Right;      Medication List       This list is accurate as of: 09/29/13 11:59 PM.  Always use your most recent med list.               acetaminophen 500 MG tablet  Commonly known as:  TYLENOL  Take 500 mg by mouth every 6 (six) hours as needed for mild pain.     amiodarone 200 MG tablet  Commonly known as:  PACERONE  Take 1 tablet (200 mg total) by mouth daily.     atorvastatin 20 MG tablet  Commonly known as:  LIPITOR  Take 20 mg by mouth daily.     fish oil-omega-3 fatty acids 1000 MG capsule  Take 1 g by mouth daily.     furosemide 40 MG tablet  Commonly known as:  LASIX  Take 1 tablet (40 mg total) by mouth 2 (two) times daily.     oxyCODONE-acetaminophen 5-325 MG per tablet  Commonly known as:  ROXICET  Take 1 tablet by mouth every 4 (four) hours as needed.     potassium chloride SA 20 MEQ tablet  Commonly known as:  K-DUR,KLOR-CON  Take 20 mEq by mouth daily.     prednisoLONE acetate 1 % ophthalmic suspension  Commonly known as:  PRED FORTE  Place 1 drop into both eyes 3 (three) times daily.     PROLENSA 0.07 % Soln  Generic drug:  Bromfenac Sodium  Place 1 drop into the left eye daily.     traMADol 50 MG tablet  Commonly known as:  ULTRAM  Take 50 mg by mouth every 6 (six) hours as needed for moderate pain.     warfarin 2.5 MG tablet  Commonly known as:  COUMADIN  Take 0.5-1 tablets (1.25-2.5 mg total) by mouth daily. Take 1/2 tablet by mouth on Mon, Wed, and Fri. Take 1 tablet by mouth all other days     ZANAFLEX 2 MG capsule  Generic drug:  tizanidine  Take 2 mg by mouth 3 (three) times daily.        Meds ordered this encounter  Medications  . tizanidine (ZANAFLEX) 2 MG capsule    Sig: Take 2 mg by mouth 3 (three) times daily.     There is no  immunization history on file for this patient.  History  Substance Use Topics  . Smoking status: Former Smoker -- 0.25 packs/day for 40 years    Types: Cigarettes    Quit date: 06/15/1994  . Smokeless tobacco: Never Used  . Alcohol Use: No    Family history is noncontributory    Review of Systems  DATA OBTAINED: from patient GENERAL: Feels well no fevers, fatigue, appetite changes SKIN: No itching, rash or wounds EYES: No eye pain, redness, discharge EARS: No earache, tinnitus, change in hearing NOSE: No congestion, drainage or bleeding  MOUTH/THROAT: No mouth or tooth pain, RESPIRATORY: No cough, wheezing, SOB CARDIAC: No chest pain, palpitations, lower extremity edema  GI: No abdominal pain, No N/V/D or constipation, No heartburn or reflux  GU: No dysuria, frequency or urgency, or incontinence  MUSCULOSKELETAL: No unrelieved bone/joint pain NEUROLOGIC: No headache, dizziness or focal weakness PSYCHIATRIC: No overt anxiety or sadness. Sleeps well. No behavior issue.   Filed Vitals:   10/01/13 2100  BP: 120/58  Pulse: 86  Temp: 98.1 F (36.7 C)  Resp: 18    Physical Exam  GENERAL APPEARANCE: Alert, conversant. Appropriately groomed. No acute distress.  SKIN: No diaphoresis rash HEAD: Normocephalic, atraumatic  EYES: Conjunctiva/lids clear. Pupils round, reactive. EOMs intact.  EARS: External exam WNL, canals clear. Hearing grossly normal.  NOSE: No deformity or discharge.  MOUTH/THROAT: Lips w/o lesions.   RESPIRATORY: Breathing is even, unlabored. Lung sounds are clear   CARDIOVASCULAR: Heart RRR no murmurs, rubs or gallops. No peripheral edema.  GASTROINTESTINAL: Abdomen is soft, non-tender, not distended w/ normal bowel sounds. GENITOURINARY: Bladder non tender, not distended  MUSCULOSKELETAL: s/p r knee arthroplasty NEUROLOGIC: Oriented X3. Cranial nerves 2-12 grossly intact. Moves all extremities no tremor. PSYCHIATRIC: Mood and affect appropriate to  situation, no behavioral issues  Patient Active Problem List   Diagnosis Date Noted  . S/P total knee arthroplasty 10/08/2013  . Anemia 09/26/2013  . Arthritis of right knee 09/19/2013  . Atrial flutter 03/30/2013  . Chronic anticoagulation   . Hyperlipidemia   . Hypertension   . CKD (chronic kidney disease), stage III   . Osteoarthritis   . Pacemaker   . Atrial fibrillation 03/24/2013    CBC    Component Value Date/Time   WBC 9.2 09/22/2013 0633   RBC 2.37* 09/22/2013 0633   HGB 7.6* 09/22/2013 1610  HCT 22.5* 09/22/2013 0633   PLT 169 09/22/2013 0633   MCV 94.9 09/22/2013 0633   LYMPHSABS 1.3 09/19/2013 0608   MONOABS 0.7 09/19/2013 0608   EOSABS 0.2 09/19/2013 0608   BASOSABS 0.0 09/19/2013 0608    CMP     Component Value Date/Time   NA 143 09/12/2013 1114   K 3.9 09/12/2013 1114   CL 102 09/12/2013 1114   CO2 28 09/12/2013 1114   GLUCOSE 101* 09/12/2013 1114   BUN 22 09/12/2013 1114   CREATININE 1.01 09/12/2013 1114   CALCIUM 9.3 09/12/2013 1114   PROT 7.8 04/17/2013 2004   ALBUMIN 3.3* 04/17/2013 2004   AST 26 04/17/2013 2004   ALT 19 04/17/2013 2004   ALKPHOS 121* 04/17/2013 2004   BILITOT 0.8 04/17/2013 2004   GFRNONAA 49* 09/12/2013 1114   GFRAA 57* 09/12/2013 1114    Assessment and Plan  S/P total knee arthroplasty Oxycodone,zanaflex for pain and warfarin as DVT prophylaxis;admitted for OT/PT  Atrial fibrillation pacerone as rate control and coumadin fot prophylaxis to continue  Hypertension Continue lasix  Hyperlipidemia Continue lipitor and omega 3  Anemia Post -op; do not see pt had transfusion;pt asymptomatic;will repeat CBC    Margit HanksAnne D Adarius Tigges, MD

## 2013-10-08 NOTE — Assessment & Plan Note (Signed)
pacerone as rate control and coumadin fot prophylaxis to continue

## 2013-10-09 ENCOUNTER — Encounter: Payer: Self-pay | Admitting: Internal Medicine

## 2013-10-09 DIAGNOSIS — I495 Sick sinus syndrome: Secondary | ICD-10-CM | POA: Insufficient documentation

## 2013-10-09 NOTE — Progress Notes (Signed)
Johny BlamerHARRIS, WILLIAM, MD: Primary Cardiologist:  Dr Maggie FontSkains  Cheryl [name] is a 80 y.o. female with a h/o sinus bradycardia sp PPM [place] in 2013 who presents today to establish care in the Electrophysiology device clinic.  [place] near [place].  She is followed by Dr Anne FuSkains for general cardiology.  The patient reports doing very well since having a pacemaker implanted and remains very active despite her age.  She underwent cardioversion in 2014 by Dr Anne FuSkains.  She has been able to maintain sinus rhythm since that time.  Today, she  denies symptoms of palpitations, chest pain, shortness of breath, dizziness, presyncope, syncope, or neurologic sequela.  The patientis tolerating medications without difficulties and is otherwise without complaint today.   Past Medical History  Diagnosis Date  . Atrial fibrillation     Prior cardioversion 10/11  . Chronic anticoagulation   . Hyperlipidemia   . Hypertension   . Sick sinus syndrome     Medtronic-10/13 implantation  . CKD (chronic kidney disease), stage III   . Anemia   . Pneumonia     "several times in my old age" (09/19/2013)  . History of blood transfusion     "related to OR" (09/19/2013)  . Osteoarthritis     "qwhere" (09/19/2013)  . Chronic right hip pain    Past Surgical History  Procedure Laterality Date  . Tonsillectomy    . Abdominal hysterectomy    . Appendectomy    . Cardioversion N/A 04/04/2013    Procedure: CARDIOVERSION;  Surgeon: Donato SchultzMark Skains, MD;  [facility];  Service: Cardiovascular;  Laterality: N/A;  . Total knee arthroplasty Right 09/19/2013  . Hernia repair      "navel"  . Umbilical hernia repair      "I don't have a belly button"  . Cataract extraction w/ intraocular lens  implant, bilateral Bilateral   . Cardioversion  03/2010    /hx medical noted above  . Dilation and curettage of uterus      "had 3 miscarriages"   . Total knee arthroplasty Right  09/19/2013    Procedure: TOTAL KNEE ARTHROPLASTY;  Surgeon: Nestor LewandowskyFrank J Rowan, MD;  Location: [facility] OR;  Service: Orthopedics;  Laterality: Right;  . Pacemaker insertion  03/29/12    MDT Revo MRI compatable PPM implanted in [place] for SSS    History   Social History  . Marital Status: Married    Spouse Name: N/A    Number of Children: N/A  . Years of Education: N/A   Occupational History  . Not on file.   Social History Main Topics  . Smoking status: Former Smoker -- 0.25 packs/day for 40 years    Types: Cigarettes    Quit date: 06/15/1994  . Smokeless tobacco: Never Used  . Alcohol Use: No  . Drug Use: No  . Sexual Activity: No   Other Topics Concern  . Not on file   Social History Narrative  . No narrative on file    Family History  Problem Relation Age of Onset  . Stroke Mother   . Heart Problems Father   . Hypertension Mother     family history  . Diabetes      family history  . CAD Father     Allergies  Allergen Reactions  . Demerol [Meperidine] Nausea And Vomiting    Current Outpatient Prescriptions  Medication Sig Dispense Refill  . acetaminophen (TYLENOL) 500 MG tablet Take  500 mg by mouth every 6 (six) hours as needed for mild pain.       Marland Kitchen. amiodarone (PACERONE) 200 MG tablet Take 1 tablet (200 mg total) by mouth daily.  90 tablet  2  . atorvastatin (LIPITOR) 20 MG tablet Take 20 mg by mouth daily.      . Bromfenac Sodium (PROLENSA) 0.07 % SOLN Place 1 drop into the left eye daily.      . fish oil-omega-3 fatty acids 1000 MG capsule Take 1 g by mouth daily.      . furosemide (LASIX) 40 MG tablet Take 1 tablet (40 mg total) by mouth 2 (two) times daily.  60 tablet  4  . oxyCODONE-acetaminophen (ROXICET) 5-325 MG per tablet Take 1 tablet by mouth every 4 (four) hours as needed.  60 tablet  0  . potassium chloride SA (K-DUR,KLOR-CON) 20 MEQ tablet Take 20 mEq by mouth daily.      . prednisoLONE acetate (PRED FORTE) 1 % ophthalmic suspension Place 1 drop  into both eyes 3 (three) times daily.      . tizanidine (ZANAFLEX) 2 MG capsule Take 2 mg by mouth 3 (three) times daily.      . traMADol (ULTRAM) 50 MG tablet Take 50 mg by mouth every 6 (six) hours as needed for moderate pain.       Marland Kitchen. warfarin (COUMADIN) 2.5 MG tablet Take 0.5-1 tablets (1.25-2.5 mg total) by mouth daily. Take 1/2 tablet by mouth on Mon, Wed, and Fri. Take 1 tablet by mouth all other days  30 tablet  0   No current facility-administered medications for this visit.    ROS- all systems are reviewed and negative except as per HPI  Physical Exam: Filed Vitals:   10/07/13 1143  BP: 154/62  Pulse: 60  Height: 5\' 6"  (1.676 m)  Weight: 163 lb (73.936 kg)    GEN- The patient is elderly appearing, alert and oriented x 3 today.   Head- normocephalic, atraumatic Eyes-  Sclera clear, conjunctiva pink Ears- hearing intact Oropharynx- clear Neck- supple,  Lungs- Clear to ausculation bilaterally, normal work of breathing Chest- pacemaker pocket is well healed Heart- Regular rate and rhythm  GI- soft, NT, ND, + BS Extremities- no clubbing, cyanosis, + edema MS- age appropriate atrophy Skin- no rash or lesion Psych- euthymic mood, full affect Neuro- strength and sensation are intact  ekg today reveals atrial pacing at 60 bpm, PR 246, otherwise normal ekg Pacemaker interrogation- reviewed in detail today,  See PACEART report  Assessment and Plan:  1. Sick sinus syndrome Normal pacemaker function See Pace Art report No changes today She AP 46% and VP 13%  2. afib Maintaining sinus rhythm with amiodarone chads2vasc score is at least 4.  She should continue coumadin  3. htn Stable No change required today  carelink Return to see Nehemiah SettleBrooke in the device clinic in 1 year Follow-up with Dr Anne FuSkains as scheduled

## 2013-10-11 ENCOUNTER — Non-Acute Institutional Stay (SKILLED_NURSING_FACILITY): Payer: Medicare Other | Admitting: Adult Health

## 2013-10-11 ENCOUNTER — Encounter: Payer: Self-pay | Admitting: Adult Health

## 2013-10-11 DIAGNOSIS — I10 Essential (primary) hypertension: Secondary | ICD-10-CM

## 2013-10-11 DIAGNOSIS — I4891 Unspecified atrial fibrillation: Secondary | ICD-10-CM

## 2013-10-11 DIAGNOSIS — IMO0002 Reserved for concepts with insufficient information to code with codable children: Secondary | ICD-10-CM

## 2013-10-11 DIAGNOSIS — D649 Anemia, unspecified: Secondary | ICD-10-CM

## 2013-10-11 DIAGNOSIS — M1710 Unilateral primary osteoarthritis, unspecified knee: Secondary | ICD-10-CM

## 2013-10-11 DIAGNOSIS — Z96659 Presence of unspecified artificial knee joint: Secondary | ICD-10-CM

## 2013-10-11 DIAGNOSIS — M1711 Unilateral primary osteoarthritis, right knee: Secondary | ICD-10-CM

## 2013-10-11 DIAGNOSIS — E785 Hyperlipidemia, unspecified: Secondary | ICD-10-CM

## 2013-10-11 NOTE — Progress Notes (Signed)
Patient ID: [name], female   DOB: 09-20-1926, 80 y.o.   MRN: [id number]                 PROGRESS NOTE  DATE: 10/11/13  FACILITY: Nursing Home [facility]: [facility]  LEVEL OF CARE: SNF (31)  Acute Visit  CHIEF COMPLAINT:  Discharge Notes  HISTORY OF PRESENT ILLNESS: This is an 80 year old female who is for discharge home with Home health PT, OT and Nursinghas been [facility] with Arthritis S/P right total knee arthroplasty. Patient was admitted to this facility for short-term rehabilitation after the patient's recent hospitalization.  Patient has completed SNF rehabilitation and therapy has cleared the patient for discharge.  REASSESSMENT OF ONGOING PROBLEM(S):  HTN: Pt 's HTN remains stable.  Denies CP, sob, DOE, pedal edema, headaches, dizziness or visual disturbances.  No complications from the medications currently being used.  Last BP :142/58  ATRIAL FIBRILLATION: the patients atrial fibrillation remains stable.  The patient denies DOE, tachycardia, orthopnea, transient neurological sx, pedal edema, palpitations, & PNDs.  No complications noted from the medications currently being used.  HYPERLIPIDEMIA: No complications from the medications presently being used.    PAST MEDICAL HISTORY : Reviewed.  No changes.  CURRENT MEDICATIONS: [facility]  REVIEW OF SYSTEMS:  GENERAL: no change in appetite, no fatigue, no weight changes, no fever, chills or weakness RESPIRATORY: no cough, SOB, DOE, wheezing, hemoptysis CARDIAC: no chest pain, or palpitations, +edema GI: no abdominal pain, diarrhea, constipation, heart burn, nausea or vomiting  PHYSICAL EXAMINATION  GENERAL: no acute distress, normal body habitus NECK: supple, trachea midline, no neck masses, no thyroid tenderness, no thyromegaly LYMPHATICS: no LAN in the neck, no supraclavicular LAN RESPIRATORY: breathing is even & unlabored, BS  CTAB CARDIAC: RRR, no murmur,no extra heart sounds, RLE edema 2+, + pacemaker GI: abdomen soft, normal BS, no masses, no tenderness, no hepatomegaly, no splenomegaly PSYCHIATRIC: the patient is alert & oriented to person, affect & behavior appropriate  LABS/RADIOLOGY: Labs reviewed: Basic Metabolic Panel:  Recent Labs  47/82/9510/15/14 1108 04/17/13 2004 09/12/13 1114  NA 142 140 143  K 4.1 4.0 3.9  CL 101 102 102  CO2 33* 28 28  GLUCOSE 86 118* 101*  BUN 26* 25* 22  CREATININE 1.2 1.40* 1.01  CALCIUM 9.4 9.9 9.3   Liver Function Tests:  Recent Labs  04/17/13 2004  AST 26  ALT 19  ALKPHOS 121*  BILITOT 0.8  PROT 7.8  ALBUMIN 3.3*   CBC:  Recent Labs  03/30/13 1108 04/17/13 2004  09/19/13 0608 09/20/13 0705 09/21/13 0845 09/22/13 0633  WBC 8.0 15.3*  < > 5.1 11.8* 11.0* 9.2  NEUTROABS 5.9 13.4*  --  3.0  --   --   --   HGB 14.4 13.4  < > 13.0 9.8* 9.4* 7.6*  HCT 43.4 40.5  < > 38.9 29.4* 28.0* 22.5*  MCV 91.2 91.4  < > 95.3 96.1 95.6 94.9  PLT 256.0 281  < > 242 204 181 169  < > = values in this interval not displayed.    ASSESSMENT/PLAN:  Arthritis S/P right total knee arthroplasty - for Home health PT, OT and Nursing Atrial Fibrillation - rate-controlled; continue Amiodarone and Coumadin Hypertension - well-controlled; continue Lasix Hyperlipidemia - continue Lipitor and Fish Oil Anemia -  check CBC CKD (chornic kidney disease), stage III - check bmp   I have filled out patient's  discharge paperwork and written prescriptions.  Patient will receive home health PT, OT and Nursing. DME provided:  Total discharge time: Less than 30 minutes Discharge time involved coordination of the discharge process with Child psychotherapistsocial worker, nursing staff and therapy department. Medical justification for home health services verified.   CPT CODE: [id number]  Ella BodoMonina Vargas - NP [facility] [phone]

## 2013-10-12 ENCOUNTER — Encounter: Payer: Self-pay | Admitting: Cardiology

## 2013-10-14 ENCOUNTER — Other Ambulatory Visit: Payer: Self-pay | Admitting: Cardiology

## 2013-10-17 ENCOUNTER — Telehealth: Payer: Self-pay | Admitting: Cardiology

## 2013-10-17 NOTE — Telephone Encounter (Signed)
New message     For Cheryl RuskJeremy Need order to check PT/INR.  Pt had knee surgery recently.

## 2013-10-17 NOTE — Telephone Encounter (Signed)
Gave orders to Edwina to check INR tomorrow.

## 2013-10-18 ENCOUNTER — Ambulatory Visit (INDEPENDENT_AMBULATORY_CARE_PROVIDER_SITE_OTHER): Payer: Medicare Other | Admitting: Cardiology

## 2013-10-18 DIAGNOSIS — I4891 Unspecified atrial fibrillation: Secondary | ICD-10-CM

## 2013-10-18 LAB — PROTIME-INR: INR: 2.7 — AB (ref 0.9–1.1)

## 2013-10-27 ENCOUNTER — Ambulatory Visit (INDEPENDENT_AMBULATORY_CARE_PROVIDER_SITE_OTHER): Payer: Medicare Other | Admitting: Pharmacist

## 2013-10-27 DIAGNOSIS — I4891 Unspecified atrial fibrillation: Secondary | ICD-10-CM

## 2013-10-27 LAB — PROTIME-INR: INR: 2.5 — AB (ref 0.9–1.1)

## 2013-11-09 ENCOUNTER — Ambulatory Visit (INDEPENDENT_AMBULATORY_CARE_PROVIDER_SITE_OTHER): Payer: Medicare Other | Admitting: Pharmacist

## 2013-11-09 DIAGNOSIS — I4891 Unspecified atrial fibrillation: Secondary | ICD-10-CM

## 2013-11-09 LAB — POCT INR: INR: 2.7

## 2013-11-24 ENCOUNTER — Ambulatory Visit (INDEPENDENT_AMBULATORY_CARE_PROVIDER_SITE_OTHER): Payer: Medicare Other

## 2013-11-24 DIAGNOSIS — I4891 Unspecified atrial fibrillation: Secondary | ICD-10-CM

## 2013-11-24 LAB — POCT INR: INR: 2.2

## 2013-12-15 ENCOUNTER — Ambulatory Visit (INDEPENDENT_AMBULATORY_CARE_PROVIDER_SITE_OTHER): Payer: Medicare Other | Admitting: *Deleted

## 2013-12-15 DIAGNOSIS — I4891 Unspecified atrial fibrillation: Secondary | ICD-10-CM

## 2013-12-15 LAB — POCT INR: INR: 2.2

## 2013-12-22 ENCOUNTER — Telehealth: Payer: Self-pay | Admitting: Cardiology

## 2013-12-22 NOTE — Telephone Encounter (Signed)
Spoke with pt granddaughter about sending manual transmission for care link smart. Granddaughter requested I send a letter with instructions to her on how to send manual transmission. I informed her I would do so and I would have it in the mail by Friday 7-10. Pt granddaughter verbalized understanding.

## 2013-12-23 ENCOUNTER — Encounter: Payer: Self-pay | Admitting: Cardiology

## 2013-12-30 ENCOUNTER — Encounter: Payer: Self-pay | Admitting: Cardiology

## 2014-01-12 ENCOUNTER — Ambulatory Visit (INDEPENDENT_AMBULATORY_CARE_PROVIDER_SITE_OTHER): Payer: Medicare Other | Admitting: *Deleted

## 2014-01-12 ENCOUNTER — Ambulatory Visit (INDEPENDENT_AMBULATORY_CARE_PROVIDER_SITE_OTHER): Payer: Medicare Other | Admitting: Pharmacist

## 2014-01-12 DIAGNOSIS — I495 Sick sinus syndrome: Secondary | ICD-10-CM

## 2014-01-12 DIAGNOSIS — I4891 Unspecified atrial fibrillation: Secondary | ICD-10-CM

## 2014-01-12 LAB — POCT INR: INR: 2.7

## 2014-01-20 LAB — MDC_IDC_ENUM_SESS_TYPE_INCLINIC
Brady Statistic AP VP Percent: 9.66 %
Brady Statistic AP VS Percent: 38.18 %
Brady Statistic AS VP Percent: 6.21 %
Brady Statistic RA Percent Paced: 47.84 %
Date Time Interrogation Session: 20150730180323
Lead Channel Pacing Threshold Amplitude: 1 V
Lead Channel Pacing Threshold Amplitude: 1 V
Lead Channel Pacing Threshold Pulse Width: 0.5 ms
Lead Channel Sensing Intrinsic Amplitude: 19.4392
Lead Channel Setting Pacing Amplitude: 2 V
Lead Channel Setting Pacing Amplitude: 2.5 V
MDC IDC MSMT BATTERY VOLTAGE: 3 V
MDC IDC MSMT LEADCHNL RA IMPEDANCE VALUE: 456 Ohm
MDC IDC MSMT LEADCHNL RA SENSING INTR AMPL: 0.826
MDC IDC MSMT LEADCHNL RV IMPEDANCE VALUE: 480 Ohm
MDC IDC MSMT LEADCHNL RV PACING THRESHOLD PULSEWIDTH: 0.5 ms
MDC IDC SET LEADCHNL RV PACING PULSEWIDTH: 0.5 ms
MDC IDC SET LEADCHNL RV SENSING SENSITIVITY: 2.1 mV
MDC IDC SET ZONE DETECTION INTERVAL: 400 ms
MDC IDC STAT BRADY AS VS PERCENT: 45.95 %
MDC IDC STAT BRADY RV PERCENT PACED: 15.87 %
Zone Setting Detection Interval: 400 ms

## 2014-01-20 NOTE — Progress Notes (Signed)
Pacemaker check in clinic due to palps. Normal device function. Thresholds, sensing, impedances consistent with previous measurements. Device programmed to maximize longevity. No mode switch or high ventricular rates noted. Device programmed at appropriate safety margins. Histogram distribution appropriate for patient activity level. Base rate decreased to 50bpm after the pt became symptomatic with Apacing here in the ofc (ok per [facility]). Batt voltage 3.0V (ERI 2.81V). Patient will follow up with JA in 6 weeks to reassess.

## 2014-02-02 ENCOUNTER — Encounter: Payer: Self-pay | Admitting: Internal Medicine

## 2014-02-24 ENCOUNTER — Other Ambulatory Visit: Payer: Self-pay

## 2014-02-27 ENCOUNTER — Encounter: Payer: Self-pay | Admitting: Internal Medicine

## 2014-02-27 ENCOUNTER — Ambulatory Visit (INDEPENDENT_AMBULATORY_CARE_PROVIDER_SITE_OTHER): Payer: Medicare Other | Admitting: Internal Medicine

## 2014-02-27 ENCOUNTER — Ambulatory Visit (INDEPENDENT_AMBULATORY_CARE_PROVIDER_SITE_OTHER): Payer: Medicare Other | Admitting: Pharmacist

## 2014-02-27 VITALS — BP 128/60 | HR 50 | Ht 65.0 in | Wt 179.0 lb

## 2014-02-27 DIAGNOSIS — Z950 Presence of cardiac pacemaker: Secondary | ICD-10-CM

## 2014-02-27 DIAGNOSIS — R011 Cardiac murmur, unspecified: Secondary | ICD-10-CM

## 2014-02-27 DIAGNOSIS — I495 Sick sinus syndrome: Secondary | ICD-10-CM

## 2014-02-27 DIAGNOSIS — R0602 Shortness of breath: Secondary | ICD-10-CM

## 2014-02-27 DIAGNOSIS — R002 Palpitations: Secondary | ICD-10-CM

## 2014-02-27 DIAGNOSIS — I4891 Unspecified atrial fibrillation: Secondary | ICD-10-CM

## 2014-02-27 LAB — MDC_IDC_ENUM_SESS_TYPE_INCLINIC
Brady Statistic AP VP Percent: 5.1 %
Brady Statistic AS VS Percent: 54.25 %
Brady Statistic RA Percent Paced: 29.08 %
Brady Statistic RV Percent Paced: 21.77 %
Lead Channel Impedance Value: 400 Ohm
Lead Channel Impedance Value: 480 Ohm
Lead Channel Pacing Threshold Amplitude: 1 V
Lead Channel Pacing Threshold Pulse Width: 0.5 ms
Lead Channel Sensing Intrinsic Amplitude: 0.6956
Lead Channel Setting Pacing Amplitude: 2 V
Lead Channel Setting Pacing Amplitude: 2.5 V
Lead Channel Setting Pacing Pulse Width: 0.5 ms
Lead Channel Setting Sensing Sensitivity: 2.1 mV
MDC IDC MSMT BATTERY VOLTAGE: 3 V
MDC IDC MSMT LEADCHNL RV PACING THRESHOLD AMPLITUDE: 1 V
MDC IDC MSMT LEADCHNL RV PACING THRESHOLD PULSEWIDTH: 0.5 ms
MDC IDC MSMT LEADCHNL RV SENSING INTR AMPL: 19.4392
MDC IDC SESS DTM: 20150914170602
MDC IDC STAT BRADY AP VS PERCENT: 23.98 %
MDC IDC STAT BRADY AS VP PERCENT: 16.67 %
Zone Setting Detection Interval: 400 ms
Zone Setting Detection Interval: 400 ms

## 2014-02-27 LAB — POCT INR: INR: 2

## 2014-02-27 NOTE — Patient Instructions (Signed)
Your physician wants you to follow-up in: 1 year with Dr. Johney Frame. You will receive a reminder letter in the mail two months in advance. If you don't receive a letter, please call our office to schedule the follow-up appointment.  Your physician recommends that you continue on your current medications as directed. Please refer to the Current Medication list given to you today.  Your physician recommends that you schedule a follow-up appointment AS SOON AS POSSIBLE WITH DR. Anne Fu.   Remote monitoring is used to monitor your Pacemaker of ICD from home. This monitoring reduces the number of office visits required to check your device to one time per year. It allows [place] to keep an eye on the functioning of your device to ensure it is working properly. You are scheduled for a device check from home on 05/29/14. You may send your transmission at any time that day. If you have a wireless device, the transmission will be sent automatically. After your physician reviews your transmission, you will receive a postcard with your next transmission date.  Your physician has requested that you have an echocardiogram. Echocardiography is a painless test that uses sound waves to create images of your heart. It provides your doctor with information about the size and shape of your heart and how well your heart's chambers and valves are working. This procedure takes approximately one hour. There are no restrictions for this procedure.  Echocardiogram An echocardiogram, or echocardiography, uses sound waves (ultrasound) to produce an image of your heart. The echocardiogram is simple, painless, obtained within a short period of time, and offers valuable information to your health care provider. The images from an echocardiogram can provide information such as:  Evidence of coronary artery disease (CAD).  Heart size.  Heart muscle function.  Heart valve function.  Aneurysm detection.  Evidence of a past heart  attack.  Fluid buildup around the heart.  Heart muscle thickening.  Assess heart valve function. LET [facility] CARE PROVIDER KNOW ABOUT:  Any allergies you have.  All medicines you are taking, including vitamins, herbs, eye drops, creams, and over-the-counter medicines.  Previous problems you or members of your family have had with the use of anesthetics.  Any blood disorders you have.  Previous surgeries you have had.  Medical conditions you have.  Possibility of pregnancy, if this applies. BEFORE THE PROCEDURE  No special preparation is needed. Eat and drink normally.  PROCEDURE   In order to produce an image of your heart, gel will be applied to your chest and a wand-like tool (transducer) will be moved over your chest. The gel will help transmit the sound waves from the transducer. The sound waves will harmlessly bounce off your heart to allow the heart images to be captured in real-time motion. These images will then be recorded.  You may need an IV to receive a medicine that improves the quality of the pictures. AFTER THE PROCEDURE You may return to your normal schedule including diet, activities, and medicines, unless your health care provider tells you otherwise. Document Released: 05/30/2000 Document Revised: 10/17/2013 Document Reviewed: 02/07/2013 [facility] Patient Information 2015 [place], [place]. This information is not intended to replace advice given to you by your health care provider. Make sure you discuss any questions you have with your health care provider.

## 2014-02-27 NOTE — Progress Notes (Signed)
Cheryl Blamer, MD: Primary Cardiologist:  Cheryl [name] is a 80 y.o. female with a h/o sinus bradycardia sp PPM (MDT) implanted in [place] in 2013 who presents today for follow-up in the Electrophysiology device clinic.  She was recently seen in the device clinic for routine follow-up.  She reported subjective tachypalpitations with pacing and her lower pacing rate was decreased to 50 bpm.  She was scheduled to follow-up with me today.   Interestingly, she continues to feel symptoms of "heart racing" though her heart rates are not elevated by device interrogation/ histograms.  She says that she feels "horrible" and attributes this to her elevated heart rate.  She currently feels that her heart rate is fast though by ekg her hear rate today is 50s.  Upon ambulation by my staff, she had subjective SOB and chest tightness which resolved upon sitting.  Her heart rates were not elevated however.  Today, she  denies symptoms of dizziness, presyncope, syncope, or neurologic sequela.  The patientis tolerating medications without difficulties and is otherwise without complaint today.   Past Medical History  Diagnosis Date  . Atrial fibrillation     Prior cardioversion 10/11  . Chronic anticoagulation   . Hyperlipidemia   . Hypertension   . Sick sinus syndrome     Medtronic-10/13 implantation  . CKD (chronic kidney disease), stage III   . Anemia   . Pneumonia     "several times in my old age" (09/19/2013)  . History of blood transfusion     "related to OR" (09/19/2013)  . Osteoarthritis     "qwhere" (09/19/2013)  . Chronic right hip pain    Past Surgical History  Procedure Laterality Date  . Tonsillectomy    . Abdominal hysterectomy    . Appendectomy    . Cardioversion N/A 04/04/2013    Procedure: CARDIOVERSION;  Surgeon: Donato Schultz, MD;  Location: [facility] ENDOSCOPY;  Service: Cardiovascular;  Laterality: N/A;  . Total knee arthroplasty Right 09/19/2013  . Hernia repair     "navel"  . Umbilical hernia repair      "I don't have a belly button"  . Cataract extraction w/ intraocular lens  implant, bilateral Bilateral   . Cardioversion  03/2010    /hx medical noted above  . Dilation and curettage of uterus      "had 3 miscarriages"   . Total knee arthroplasty Right 09/19/2013    Procedure: TOTAL KNEE ARTHROPLASTY;  Surgeon: Nestor Lewandowsky, MD;  Location: [facility] OR;  Service: Orthopedics;  Laterality: Right;  . Pacemaker insertion  03/29/12    MDT Revo MRI compatable PPM implanted in [place] for SSS    History   Social History  . Marital Status: Married    Spouse Name: N/A    Number of Children: N/A  . Years of Education: N/A   Occupational History  . Not on file.   Social History Main Topics  . Smoking status: Former Smoker -- 0.25 packs/day for 40 years    Types: Cigarettes    Quit date: 06/15/1994  . Smokeless tobacco: Never Used  . Alcohol Use: No  . Drug Use: No  . Sexual Activity: No   Other Topics Concern  . Not on file   Social History Narrative  . No narrative on file    Family History  Problem Relation Age of Onset  . Stroke Mother   . Heart Problems Father   . Hypertension Mother  family history  . Diabetes      family history  . CAD Father     Allergies  Allergen Reactions  . Demerol [Meperidine] Nausea And Vomiting    Current Outpatient Prescriptions  Medication Sig Dispense Refill  . acetaminophen (TYLENOL) 500 MG tablet Take 500 mg by mouth every 6 (six) hours as needed for mild pain.       Marland Kitchen amiodarone (PACERONE) 200 MG tablet Take 1 tablet (200 mg total) by mouth daily.  90 tablet  2  . atorvastatin (LIPITOR) 20 MG tablet Take 20 mg by mouth daily.      . furosemide (LASIX) 40 MG tablet take 1 tablet by mouth twice a day  60 tablet  4  . potassium chloride SA (K-DUR,KLOR-CON) 20 MEQ tablet Take 20 mEq by mouth daily.      . traMADol (ULTRAM) 50 MG tablet Take 50 mg by mouth every 6 (six) hours as needed for  moderate pain.       Marland Kitchen warfarin (COUMADIN) 2.5 MG tablet Take 0.5-1 tablets (1.25-2.5 mg total) by mouth daily. Take 1/2 tablet by mouth on Mon, Wed, and Fri. Take 1 tablet by mouth all other days  30 tablet  0   No current facility-administered medications for this visit.    ROS- all systems are reviewed and negative except as per HPI  Physical Exam: Filed Vitals:   02/27/14 1446  BP: 128/60  Pulse: 50  Height:  (1.651 m)  Weight: 179 lb (81.194 kg)    GEN- The patient is elderly appearing, alert and oriented x 3 today.   Head- normocephalic, atraumatic Eyes-  Sclera clear, conjunctiva pink Ears- hearing intact Oropharynx- clear Neck- supple,  Lungs- Clear to ausculation bilaterally, normal work of breathing Chest- pacemaker pocket is well healed Heart- Regular rate and rhythm, 2/6 SEM LUSB (mid peaking) GI- soft, NT, ND, + BS Extremities- no clubbing, cyanosis, + edema MS- age appropriate atrophy Skin- no rash or lesion Psych- euthymic mood, full affect Neuro- strength and sensation are intact  ekg today reveals atrial pacing at 50 bpm, PR 258, otherwise normal ekg Pacemaker interrogation- reviewed in detail today,  See PACEART report  Assessment and Plan:  1. Sick sinus syndrome Normal pacemaker function See Pace Art report No changes today No evidence of elevated heart rates by device interrogation today No changes made  2. afib Maintaining sinus rhythm with amiodarone chads2vasc score is at least 4.  She should continue coumadin  3. Hypertensive cardiovascular disease She is very worried about her BP.  By me today, her BP (left) was 144/70 and (right) 127/52 No changes are made today  4. Subjective tachycardia/ SOB/ chest discomfort Unclear etiology She says that she has had blood work by Cheryl Tiburcio Pea recently.  I would recommend CBC and TFTs if not already obtained.  Her PPM function is normal and not the cause for her symptoms.  Given murmur on exam and  symptoms, I will obtain an echo.  Consideration to stress testing could be considered, though given her advanced age, I would prefer a more conservative route.  I will arrange follow-up with Cheryl Anne Fu following the echo for further evaluation.  carelink Follow-up with Cheryl Anne Fu as above Return to the device clinic in 1 year

## 2014-03-11 ENCOUNTER — Other Ambulatory Visit: Payer: Self-pay | Admitting: Cardiology

## 2014-03-15 ENCOUNTER — Ambulatory Visit (HOSPITAL_COMMUNITY): Payer: Medicare Other | Attending: Internal Medicine | Admitting: Radiology

## 2014-03-15 ENCOUNTER — Ambulatory Visit (INDEPENDENT_AMBULATORY_CARE_PROVIDER_SITE_OTHER): Payer: Medicare Other | Admitting: Cardiology

## 2014-03-15 ENCOUNTER — Encounter: Payer: Self-pay | Admitting: Cardiology

## 2014-03-15 VITALS — BP 138/88 | HR 68 | Ht 67.5 in | Wt 180.0 lb

## 2014-03-15 DIAGNOSIS — I4891 Unspecified atrial fibrillation: Secondary | ICD-10-CM | POA: Diagnosis not present

## 2014-03-15 DIAGNOSIS — R0602 Shortness of breath: Secondary | ICD-10-CM

## 2014-03-15 DIAGNOSIS — I482 Chronic atrial fibrillation, unspecified: Secondary | ICD-10-CM

## 2014-03-15 DIAGNOSIS — E785 Hyperlipidemia, unspecified: Secondary | ICD-10-CM | POA: Diagnosis not present

## 2014-03-15 DIAGNOSIS — Z950 Presence of cardiac pacemaker: Secondary | ICD-10-CM

## 2014-03-15 DIAGNOSIS — I10 Essential (primary) hypertension: Secondary | ICD-10-CM | POA: Insufficient documentation

## 2014-03-15 DIAGNOSIS — I495 Sick sinus syndrome: Secondary | ICD-10-CM

## 2014-03-15 DIAGNOSIS — R0989 Other specified symptoms and signs involving the circulatory and respiratory systems: Secondary | ICD-10-CM

## 2014-03-15 DIAGNOSIS — R011 Cardiac murmur, unspecified: Secondary | ICD-10-CM

## 2014-03-15 DIAGNOSIS — R002 Palpitations: Secondary | ICD-10-CM

## 2014-03-15 NOTE — Patient Instructions (Signed)
The current medical regimen is effective;  continue present plan and medications.  Follow up in 6 months with Dr. Skains.  You will receive a letter in the mail 2 months before you are due.  Please call us when you receive this letter to schedule your follow up appointment.  

## 2014-03-15 NOTE — Progress Notes (Signed)
[address] [place], [place]  [address] Phone: [phone] Fax:  [phone]  Date:  03/15/2014   ID:  Cheryl [name], DOB 1926-10-13, MRN [id number]  PCP:  Johny Blamer, MD   History of Present Illness: Cheryl [name] is a 80 y.o. female with atrial fibrillation/flutter on chronic anticoagulation, pacemaker, hypertension, hyperlipidemia, chronic amiodarone use here for followup.  She has a Medtronic pacemaker implanted in October of 2013. At a previous hospitalization in [place] where her potassium was low, acute renal failure secondary to metolazone and Lasix. She is relatively new to the area 8/14. Overall she is doing very well. Prior myocardial infarction, prior stroke. LDL 91 on 10/21/12. Hemoglobin 12.9. TSH normal. BNP 787 July of 2014. Prior cardioversion October of 2011. EKG previously had shown an ectopic atrial rhythm with rate of 55. Current EKG shows atrial flutter with variable AV block. After prior cardioversion went back into fib.   According to her recent pacemaker check, she has been in chronic atrial fibrillation/flutter since mid June of 2014. Prior to that, she seemed to be in paroxysmal atrial fibrillation. She has been on amiodarone for quite some time. She had a cardioversion after being on amiodarone in [place]. Afternoon nap, sometimes wakes up without warning and startled with gasping. Changes position and this helps. If takes a big breath feels better, goes away.  She saw Dr. Johney Frame on 02/27/14 and was complaining of sensation of racing heartbeat. Her pacemaker interrogation showed normal function. No tachycardia. Echocardiogram was ordered. She states that she is feeling better, no shortness of breath, no chest pain.  Wt Readings from Last 3 Encounters:  03/15/14 180 lb (81.647 kg)  02/27/14 179 lb (81.194 kg)  10/11/13 164 lb 6.4 oz (74.571 kg)     Past Medical History  Diagnosis Date  . Atrial fibrillation     Prior  cardioversion 10/11  . Chronic anticoagulation   . Hyperlipidemia   . Hypertension   . Sick sinus syndrome     Medtronic-10/13 implantation  . CKD (chronic kidney disease), stage III   . Anemia   . Pneumonia     "several times in my old age" (09/19/2013)  . History of blood transfusion     "related to OR" (09/19/2013)  . Osteoarthritis     "qwhere" (09/19/2013)  . Chronic right hip pain     Past Surgical History  Procedure Laterality Date  . Tonsillectomy    . Abdominal hysterectomy    . Appendectomy    . Cardioversion N/A 04/04/2013    Procedure: CARDIOVERSION;  Surgeon: Donato Schultz, MD;  Location[facility] ENDOSCOPY;  Service: Cardiovascular;  Laterality: N/A;  . Total knee arthroplasty Right 09/19/2013  . Hernia repair      "navel"  . Umbilical hernia repair      "I don't have a belly button"  . Cataract extraction w/ intraocular lens  implant, bilateral Bilateral   . Cardioversion  03/2010    /hx medical noted above  . Dilation and curettage of uterus      "had 3 miscarriages"   . Total knee arthroplasty Right 09/19/2013    Procedure: TOTAL KNEE ARTHROPLASTY;  Surgeon: Nestor Lewandowsky, MD;  Location: [facility] OR;  Service: Orthopedics;  Laterality: Right;  . Pacemaker insertion  03/29/12    MDT Revo MRI compatable PPM implanted in [place] for SSS    Current Outpatient Prescriptions  Medication Sig Dispense Refill  . acetaminophen (TYLENOL) 500  MG tablet Take 500 mg by mouth every 6 (six) hours as needed for mild pain.       Marland Kitchen. amiodarone (PACERONE) 200 MG tablet Take 1 tablet (200 mg total) by mouth daily.  90 tablet  2  . atorvastatin (LIPITOR) 20 MG tablet take 1 tablet by mouth once daily  30 tablet  0  . furosemide (LASIX) 40 MG tablet take 1 tablet by mouth twice a day  60 tablet  4  . potassium chloride SA (K-DUR,KLOR-CON) 20 MEQ tablet Take 20 mEq by mouth daily.      . traMADol (ULTRAM) 50 MG tablet Take 50 mg by mouth every 6 (six) hours as needed for moderate pain.       Marland Kitchen.  warfarin (COUMADIN) 2.5 MG tablet Take 0.5-1 tablets (1.25-2.5 mg total) by mouth daily. Take 1/2 tablet by mouth on Mon, Wed, and Fri. Take 1 tablet by mouth all other days  30 tablet  0   No current facility-administered medications for this visit.    Allergies:    Allergies  Allergen Reactions  . Demerol [Meperidine] Nausea And Vomiting    Social History:  The patient  reports that she quit smoking about 19 years ago. Her smoking use included Cigarettes. She has a 10 pack-year smoking history. She has never used smokeless tobacco. She reports that she does not drink alcohol or use illicit drugs.   ROS:  Please see the history of present illness.   No CP, no SOB. Had fall. Right knee swelling.   PHYSICAL EXAM: VS:  BP 138/88  Pulse 68  Ht 5' 7.5" (1.715 m)  Wt 180 lb (81.647 kg)  BMI 27.76 kg/m2 Well nourished, well developed, in no acute distress HEENT: normal Neck: no JVD Cardiac:  normal S1, S2; RRR; 3/6 SEM murmurradiation to carotids.  Lungs:  clear to auscultation bilaterally, no wheezing, rhonchi or rales Abd: soft, nontender, no hepatomegaly Ext: no edemaright knee effusion Skin: warm and dry Neuro: no focal abnormalities noted  EKG:  None today. Prior cardioversion.    ASSESSMENT AND PLAN:  1. Atrial flutter-s/p cardioversion. Amiodarone, will decrease to 200mg  once a day.  Interestingly, she still feels occasional palpitations. Pacemaker interrogation showed normal function.  2. Chronic anticoagulation-Well monitored.  3. Pacemaker-prior report noted.    4. Weight gain-likely diastolic dysfunction/mild diastolic heart failure.Doing well. Lasix. No signs of volume overload. Signed, Donato SchultzMark Freddye Cardamone, MD [facility]  03/15/2014 12:09 PM

## 2014-03-15 NOTE — Progress Notes (Signed)
Echocardiogram performed.  

## 2014-03-28 ENCOUNTER — Other Ambulatory Visit: Payer: Self-pay | Admitting: Cardiology

## 2014-04-01 IMAGING — CR DG CHEST 2V
2 series · 2 of 2 positions shown · non-contrast
Comparison: None.

CLINICAL DATA: Recent traumatic injury with pain

EXAM:
CHEST  2 VIEW

[w chest lat]
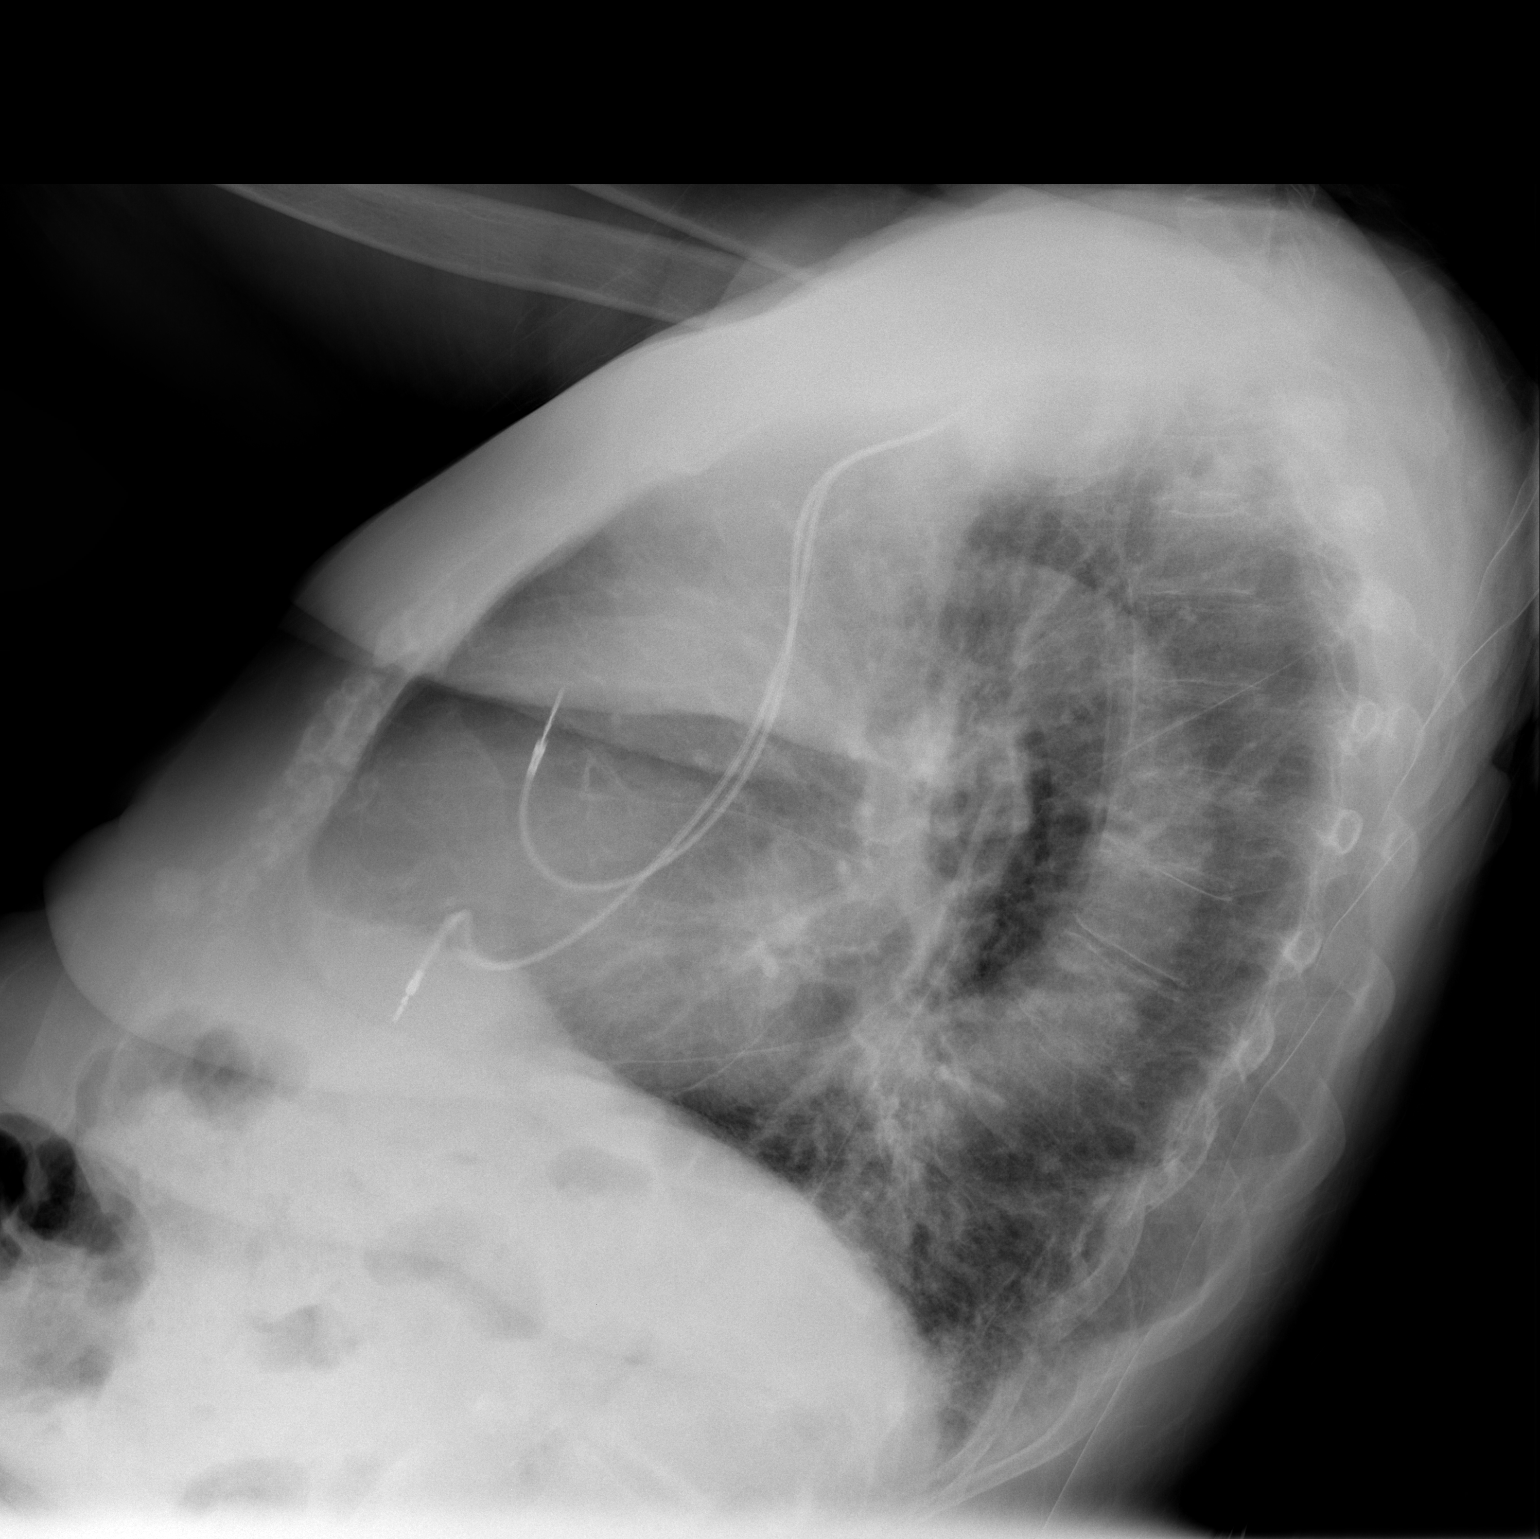

[t chest supine]
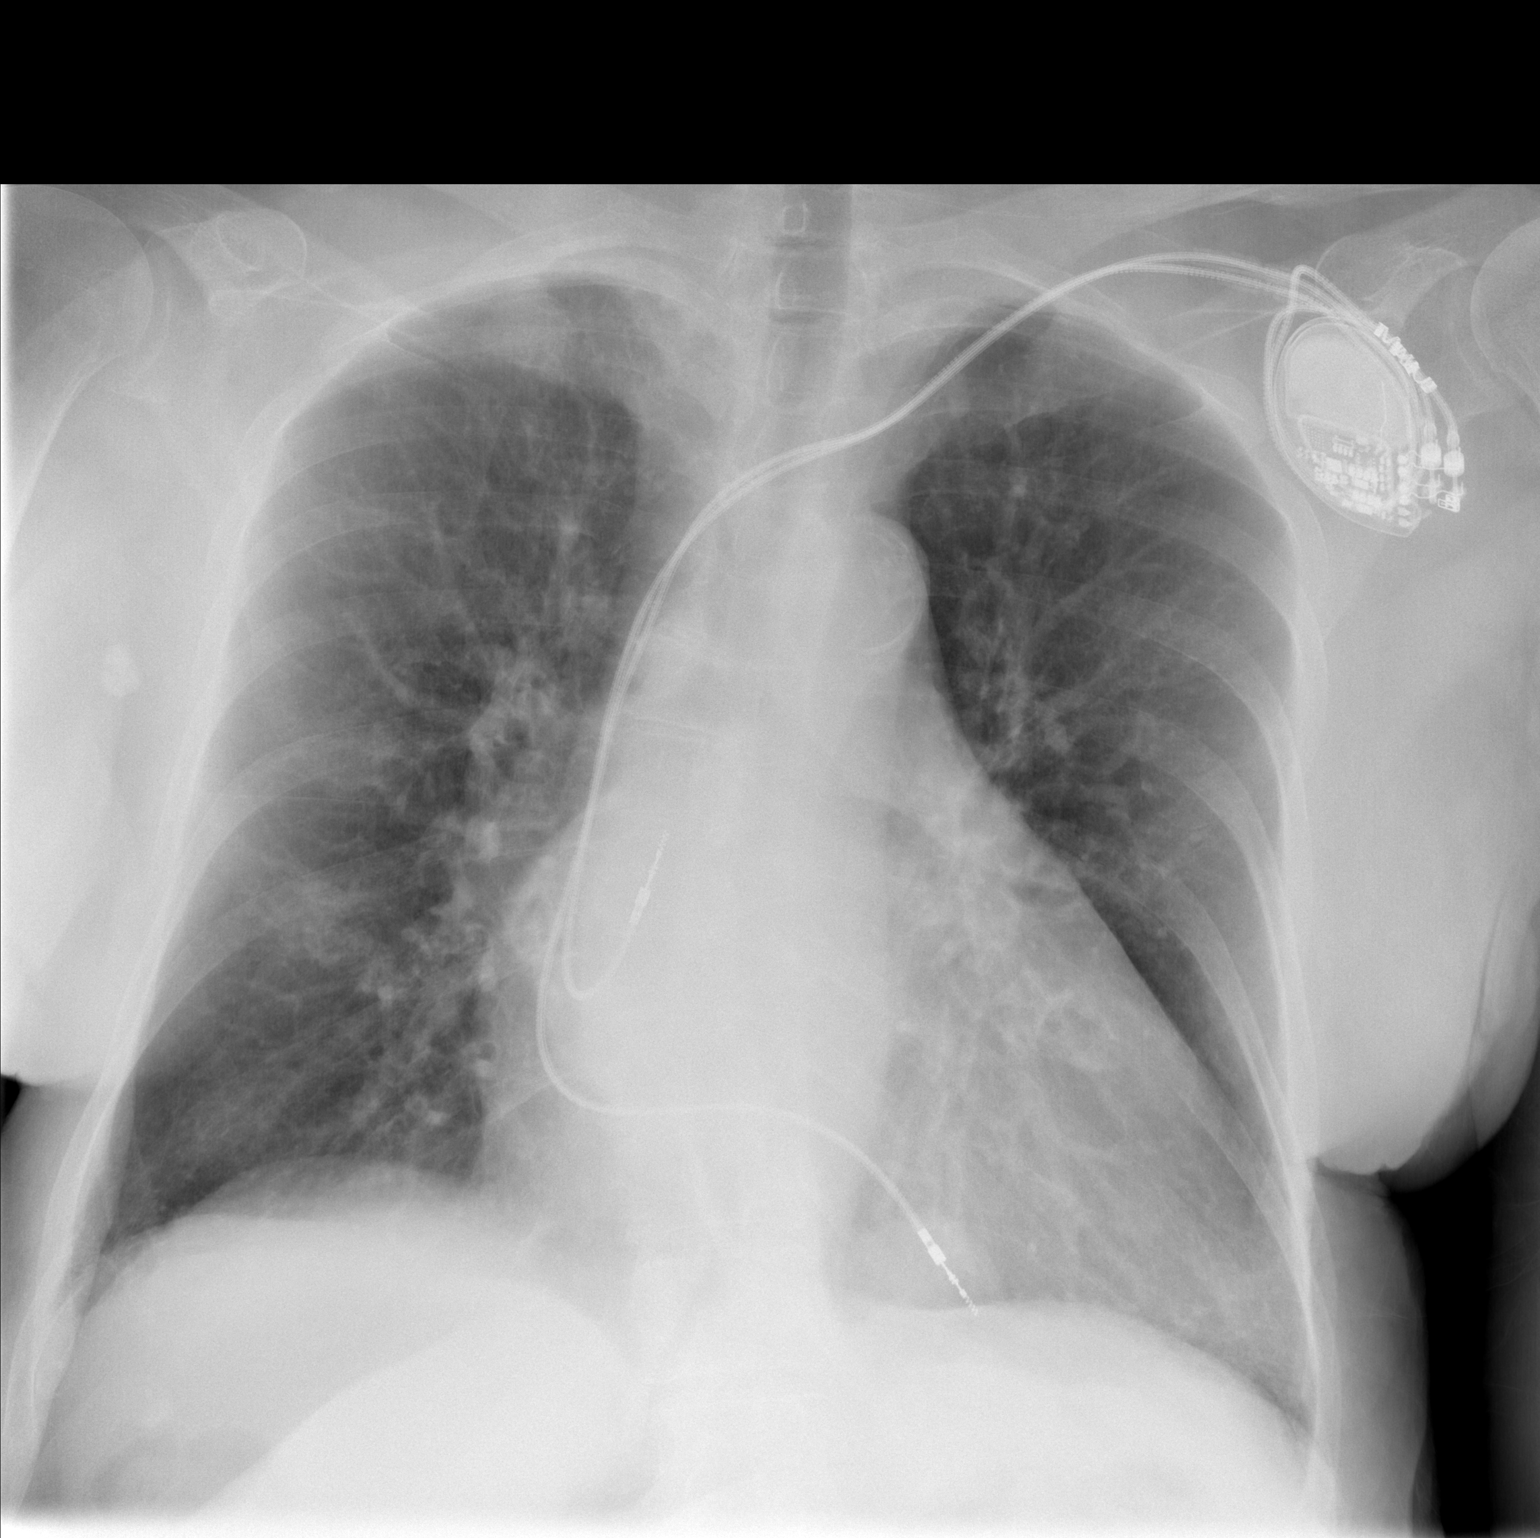

[2 of 2 positions shown; findings below may reference images not displayed]

FINDINGS: The cardiac shadow is mildly enlarged. A pacing device is seen.
There is a rounded density identified in the mid to lower right
lung. This may simply represent a nipple shadow. No focal infiltrate
is seen.
IMPRESSION: Questionable nodular density on the right. Repeat imaging with
nipple markers is recommended to rule out an underlying nodule.

## 2014-04-01 IMAGING — CR DG CHEST 2V
1 series · 1 of 1 positions shown · non-contrast
Comparison: CHEST x-ray 04/17/2013.

CLINICAL DATA: Ankle pain. Followup evaluation for possible nodular
opacity. Nipple markers in place on this examination.

EXAM:
CHEST  2 VIEW

[w chest lat]
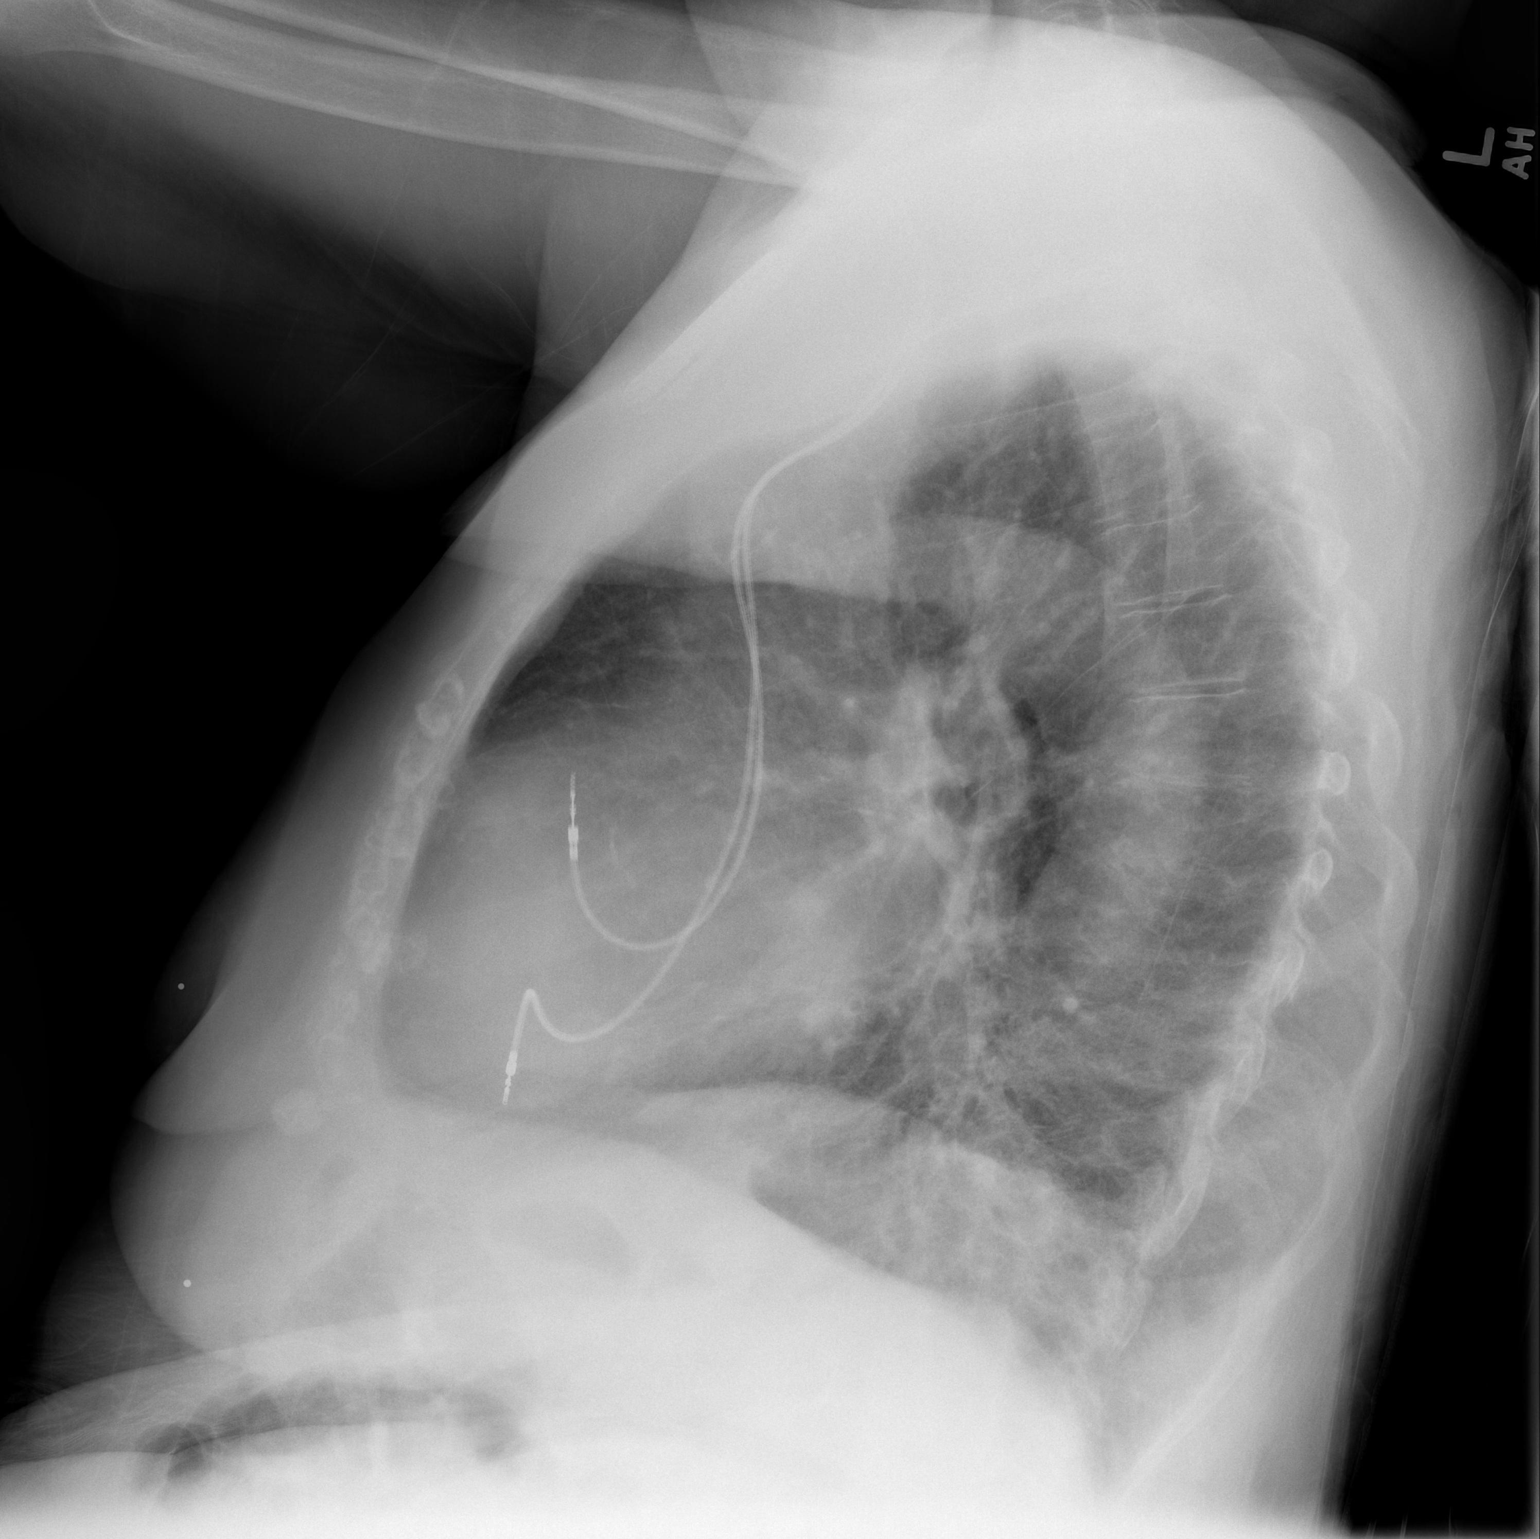

[1 of 1 positions shown; findings below may reference images not displayed]

FINDINGS: There is cephalization of the pulmonary vasculature and slight
indistinctness of the interstitial markings suggestive of mild
pulmonary edema. Mild cardiomegaly No definite consolidative
airspace disease. Previously suspected nodular opacity in the right
mid lung corresponds to prominent 5th costochondral junction
anteriorly. No definite nodular opacity identified on today's study.
Trace right pleural effusion. Upper mediastinal contours are within
normal limits. Atherosclerosis in the thoracic aorta. Left-sided
pacemaker device in place with lead tips projecting over the
expected location of the right atrium and right ventricular apex.
IMPRESSION: 1. No suspicious pulmonary nodule identified.
2. The appearance the chest suggests mild congestive heart failure,
as above.
3. Atherosclerosis.

## 2014-04-14 ENCOUNTER — Ambulatory Visit (INDEPENDENT_AMBULATORY_CARE_PROVIDER_SITE_OTHER): Payer: Medicare Other | Admitting: *Deleted

## 2014-04-14 ENCOUNTER — Other Ambulatory Visit: Payer: Self-pay | Admitting: Cardiology

## 2014-04-14 DIAGNOSIS — I4891 Unspecified atrial fibrillation: Secondary | ICD-10-CM

## 2014-04-14 LAB — POCT INR: INR: 2.7

## 2014-04-17 ENCOUNTER — Other Ambulatory Visit: Payer: Self-pay | Admitting: Cardiology

## 2014-05-26 ENCOUNTER — Ambulatory Visit (INDEPENDENT_AMBULATORY_CARE_PROVIDER_SITE_OTHER): Payer: Medicare Other | Admitting: Pharmacist

## 2014-05-26 DIAGNOSIS — I4891 Unspecified atrial fibrillation: Secondary | ICD-10-CM

## 2014-05-26 LAB — POCT INR: INR: 2.7

## 2014-05-29 ENCOUNTER — Encounter: Payer: Medicare Other | Admitting: *Deleted

## 2014-05-29 ENCOUNTER — Telehealth: Payer: Self-pay | Admitting: Internal Medicine

## 2014-05-29 ENCOUNTER — Telehealth: Payer: Self-pay | Admitting: Cardiology

## 2014-05-29 NOTE — Telephone Encounter (Signed)
LMOVM for pt granddaughter to return my call.

## 2014-05-29 NOTE — Telephone Encounter (Signed)
New Msg    Please contact granddaughter Greta DoomGeorgie about pacemaker check, states they are still having trouble completing it.  Please call cell [phone].

## 2014-05-29 NOTE — Telephone Encounter (Signed)
LMOVM reminding pt to send remote transmission.   

## 2014-05-29 NOTE — Telephone Encounter (Signed)
Spoke w/grandduaghter--ordered new Carelink smart monitor and rescheduled remote from today to 06-27-14. Return kit also ordered for pt to return old monitor. Gradndaughter aware of this.

## 2014-06-02 ENCOUNTER — Encounter: Payer: Self-pay | Admitting: Cardiology

## 2014-06-26 ENCOUNTER — Other Ambulatory Visit: Payer: Self-pay | Admitting: Cardiology

## 2014-06-27 ENCOUNTER — Ambulatory Visit (INDEPENDENT_AMBULATORY_CARE_PROVIDER_SITE_OTHER): Payer: Medicare Other | Admitting: *Deleted

## 2014-06-27 DIAGNOSIS — Z950 Presence of cardiac pacemaker: Secondary | ICD-10-CM

## 2014-06-27 DIAGNOSIS — I4891 Unspecified atrial fibrillation: Secondary | ICD-10-CM

## 2014-06-27 LAB — MDC_IDC_ENUM_SESS_TYPE_REMOTE
Battery Voltage: 3 V
Brady Statistic AP VP Percent: 4.7 %
Brady Statistic AP VS Percent: 30.59 %
Brady Statistic AS VP Percent: 6.2 %
Brady Statistic AS VS Percent: 58.51 %
Brady Statistic RA Percent Paced: 35.29 %
Brady Statistic RV Percent Paced: 10.9 %
Date Time Interrogation Session: 20160112141026
Lead Channel Impedance Value: 480 Ohm
Lead Channel Impedance Value: 488 Ohm
Lead Channel Setting Pacing Amplitude: 2 V
Lead Channel Setting Pacing Amplitude: 2.5 V
Lead Channel Setting Sensing Sensitivity: 2.1 mV
MDC IDC MSMT LEADCHNL RA SENSING INTR AMPL: 0.8 mV
MDC IDC MSMT LEADCHNL RV SENSING INTR AMPL: 19.4 mV
MDC IDC SET LEADCHNL RV PACING PULSEWIDTH: 0.5 ms
Zone Setting Detection Interval: 400 ms
Zone Setting Detection Interval: 400 ms

## 2014-06-27 NOTE — Progress Notes (Signed)
Pacemaker remote check. Device function reviewed. Impedance, sensing consistent with previous measurements. Histograms appropriate for patient and level of activity. All other diagnostic data reviewed and is appropriate and stable for patient. Real time/magnet EGM shows appropriate sensing and capture. No mode switch or ventricular high rate episodes. Battery voltage 3.00V. Carelink 09/25/14 & ROV w/ Fawn KirkJA 9/16.

## 2014-06-28 ENCOUNTER — Other Ambulatory Visit: Payer: Self-pay | Admitting: *Deleted

## 2014-06-28 MED ORDER — AMIODARONE HCL 200 MG PO TABS: 200.0000 mg | ORAL_TABLET | Freq: Every day | ORAL | Status: DC

## 2014-06-30 ENCOUNTER — Encounter: Payer: Self-pay | Admitting: Cardiology

## 2014-07-05 ENCOUNTER — Encounter: Payer: Self-pay | Admitting: Internal Medicine

## 2014-07-10 ENCOUNTER — Ambulatory Visit (INDEPENDENT_AMBULATORY_CARE_PROVIDER_SITE_OTHER): Payer: Medicare Other | Admitting: *Deleted

## 2014-07-10 DIAGNOSIS — I4891 Unspecified atrial fibrillation: Secondary | ICD-10-CM

## 2014-07-10 LAB — POCT INR: INR: 2.5

## 2014-08-17 ENCOUNTER — Ambulatory Visit: Payer: Medicare Other | Admitting: Cardiology

## 2014-08-22 ENCOUNTER — Encounter: Payer: Self-pay | Admitting: Cardiology

## 2014-08-30 ENCOUNTER — Ambulatory Visit (INDEPENDENT_AMBULATORY_CARE_PROVIDER_SITE_OTHER): Payer: Medicare Other | Admitting: Cardiovascular Disease

## 2014-08-30 DIAGNOSIS — I4891 Unspecified atrial fibrillation: Secondary | ICD-10-CM

## 2014-08-30 LAB — POCT INR: INR: 3.3

## 2014-09-04 ENCOUNTER — Ambulatory Visit (INDEPENDENT_AMBULATORY_CARE_PROVIDER_SITE_OTHER): Payer: Medicare Other | Admitting: Cardiology

## 2014-09-04 ENCOUNTER — Other Ambulatory Visit: Payer: Self-pay | Admitting: *Deleted

## 2014-09-04 ENCOUNTER — Encounter: Payer: Self-pay | Admitting: Cardiology

## 2014-09-04 VITALS — BP 130/70 | HR 77 | Ht 65.0 in | Wt 192.0 lb

## 2014-09-04 DIAGNOSIS — Z7901 Long term (current) use of anticoagulants: Secondary | ICD-10-CM

## 2014-09-04 DIAGNOSIS — I480 Paroxysmal atrial fibrillation: Secondary | ICD-10-CM | POA: Diagnosis not present

## 2014-09-04 DIAGNOSIS — Z950 Presence of cardiac pacemaker: Secondary | ICD-10-CM

## 2014-09-04 MED ORDER — POTASSIUM CHLORIDE CRYS ER 20 MEQ PO TBCR: 20.0000 meq | EXTENDED_RELEASE_TABLET | Freq: Every day | ORAL | Status: DC

## 2014-09-04 MED ORDER — AMIODARONE HCL 200 MG PO TABS: 200.0000 mg | ORAL_TABLET | Freq: Every day | ORAL | Status: DC

## 2014-09-04 MED ORDER — FUROSEMIDE 40 MG PO TABS: 40.0000 mg | ORAL_TABLET | Freq: Two times a day (BID) | ORAL | Status: DC

## 2014-09-04 MED ORDER — ATORVASTATIN CALCIUM 20 MG PO TABS: 20.0000 mg | ORAL_TABLET | Freq: Every day | ORAL | Status: DC

## 2014-09-04 NOTE — Progress Notes (Signed)
[address] [place], [place]  [address] Phone: [phone] Fax:  [phone]  Date:  09/04/2014   ID:  BERNITA BECKSTROM, DOB Jul 12, 1926, MRN [id number]  PCP:  Johny Blamer, MD   History of Present Illness: Cheryl [name] is a 81 y.o. female with atrial fibrillation/flutter on chronic anticoagulation, pacemaker, hypertension, hyperlipidemia, chronic amiodarone use here for followup.  She has a Medtronic pacemaker implanted in October of 2013. She is relatively new to the area 8/14. Prior myocardial infarction, prior stroke. LDL 91 on 10/21/12. Hemoglobin 12.9. TSH normal. BNP 787 July of 2014. Prior cardioversion October of 2011. EKG previously had shown an ectopic atrial rhythm with rate of 55.  After prior cardioversion went back into fib.   After pacemaker check, she has been in chronic atrial fibrillation/flutter since mid June of 2014. Prior to that, she seemed to be in paroxysmal atrial fibrillation. She has been on amiodarone for quite some time. She had cardioversion on 04/04/13. She had a cardioversion after being on amiodarone in [place].   She saw Dr. Johney Frame on 02/27/14 and was complaining of sensation of racing heartbeat. Her pacemaker interrogation showed normal function. No tachycardia.   She has been taking her amiodarone. She has noted that at times she cannot put 1 foot in front of the other, decreased energy. Shortness of breath at times, over the past few weeks.   Wt Readings from Last 3 Encounters:  09/04/14 192 lb (87.091 kg)  03/15/14 180 lb (81.647 kg)  02/27/14 179 lb (81.194 kg)     Past Medical History  Diagnosis Date  . Atrial fibrillation     Prior cardioversion 10/11  . Chronic anticoagulation   . Hyperlipidemia   . Hypertension   . Sick sinus syndrome     Medtronic-10/13 implantation  . CKD (chronic kidney disease), stage III   . Anemia   . Pneumonia     "several times in my old age" (09/19/2013)  . History of blood  transfusion     "related to OR" (09/19/2013)  . Osteoarthritis     "qwhere" (09/19/2013)  . Chronic right hip pain     Past Surgical History  Procedure Laterality Date  . Tonsillectomy    . Abdominal hysterectomy    . Appendectomy    . Cardioversion N/A 04/04/2013    Procedure: CARDIOVERSION;  Surgeon: Donato Schultz, MD;  Location: [facility] ENDOSCOPY;  Service: Cardiovascular;  Laterality: N/A;  . Total knee arthroplasty Right 09/19/2013  . Hernia repair      "navel"  . Umbilical hernia repair      "I don't have a belly button"  . Cataract extraction w/ intraocular lens  implant, bilateral Bilateral   . Cardioversion  03/2010    /hx medical noted above  . Dilation and curettage of uterus      "had 3 miscarriages"   . Total knee arthroplasty Right 09/19/2013    Procedure: TOTAL KNEE ARTHROPLASTY;  Surgeon: Nestor Lewandowsky, MD;  Location: [facility] OR;  Service: Orthopedics;  Laterality: Right;  . Pacemaker insertion  03/29/12    MDT Revo MRI compatable PPM implanted in [place] for SSS    Current Outpatient Prescriptions  Medication Sig Dispense Refill  . acetaminophen (TYLENOL) 500 MG tablet Take 500 mg by mouth every 6 (six) hours as needed for mild pain.     Marland Kitchen amiodarone (PACERONE) 200 MG tablet Take 1 tablet (200 mg total) by mouth daily. 90 tablet  0  . atorvastatin (LIPITOR) 20 MG tablet take 1 tablet by mouth once daily 30 tablet 4  . furosemide (LASIX) 40 MG tablet take 1 tablet by mouth twice a day 60 tablet 4  . potassium chloride SA (K-DUR,KLOR-CON) 20 MEQ tablet take 1 tablet by mouth once daily 30 tablet 6  . warfarin (COUMADIN) 2.5 MG tablet take 1 tablet by mouth once daily 30 tablet 2   No current facility-administered medications for this visit.    Allergies:    Allergies  Allergen Reactions  . Demerol [Meperidine] Nausea And Vomiting    Social History:  The patient  reports that she quit smoking about 20 years ago. Her smoking use included Cigarettes. She has a 10 pack-year  smoking history. She has never used smokeless tobacco. She reports that she does not drink alcohol or use illicit drugs.   ROS:  Please see the history of present illness.   No CP, no SOB. Had fall. Right knee swelling.   PHYSICAL EXAM: VS:  Ht 5\' 5"  (1.651 m)  Wt 192 lb (87.091 kg)  BMI 31.95 kg/m2 Well nourished, well developed, in no acute distress HEENT: normal Neck: no JVD Cardiac:  Irregular normal rate; 3/6 SEM murmur radiation to carotids.  Lungs:  clear to auscultation bilaterally, no wheezing, rhonchi or rales Abd: soft, nontender, no hepatomegaly Ext: no edemaright knee effusion Skin: warm and dry Neuro: no focal abnormalities noted  EKG:  None today. Prior cardioversion.    Echocardiogram: 2015  - Left ventricle: The cavity size was normal. Wall thickness was increased in a pattern of mild LVH. Systolic function was normal. The estimated ejection fraction was in the range of 60% to 65%. Wall motion was normal; there were no regional wall motion abnormalities. - Aortic valve: Valve mobility was moderately restricted. There was moderate stenosis. There was mild regurgitation. - Mitral valve: There was mild regurgitation. - Left atrium: The atrium was moderately dilated.  ASSESSMENT AND PLAN:  1. Atrial flutter/fibrillation-s/p cardioversion 03/2013. Has been maintaining sinus rhythm fairly well up until recently. Amiodarone, 200mg  once a day. Interestingly, her pacemaker interrogation shows data entry points of AFIB only from March 20 which is yesterday however upon further review, Dr. Johney FrameAllred believes that her atrial fibrillation has been occurring for the past few weeks. This may actually correlate with some of her symptoms and after discussion, we will proceed with cardioversion. She has had this done before. We will continue with amiodarone.  2. Chronic anticoagulation-Well monitored.  3. Pacemaker-interrogation performed today.  4. Weight gain- No signs of  volume overload. She has lost approximately 4 pounds over the last few weeks.  Signed, Donato SchultzMark Keelen Quevedo, MD [facility]  09/04/2014 3:57 PM

## 2014-09-04 NOTE — Patient Instructions (Addendum)
The current medical regimen is effective;  continue present plan and medications.  Your physician has requested that you have a Cardioversion.  Electrical Cardioversion uses a jolt of electricity to your heart either through paddles or wired patches attached to your chest. This is a controlled, usually prescheduled, procedure. This procedure is done at the hospital and you are not awake during the procedure. You usually go home the day of the procedure. Please see the instruction sheet given to you today for more information.  Thank you for choosing [facility]!!

## 2014-09-08 ENCOUNTER — Telehealth: Payer: Self-pay | Admitting: *Deleted

## 2014-09-08 NOTE — Telephone Encounter (Signed)
Opened in error

## 2014-09-13 ENCOUNTER — Ambulatory Visit (INDEPENDENT_AMBULATORY_CARE_PROVIDER_SITE_OTHER): Payer: Medicare Other | Admitting: *Deleted

## 2014-09-13 DIAGNOSIS — I4891 Unspecified atrial fibrillation: Secondary | ICD-10-CM | POA: Diagnosis not present

## 2014-09-13 LAB — POCT INR: INR: 4

## 2014-09-21 ENCOUNTER — Encounter: Payer: Self-pay | Admitting: Internal Medicine

## 2014-09-22 ENCOUNTER — Ambulatory Visit (INDEPENDENT_AMBULATORY_CARE_PROVIDER_SITE_OTHER): Payer: Medicare Other

## 2014-09-22 DIAGNOSIS — I4891 Unspecified atrial fibrillation: Secondary | ICD-10-CM

## 2014-09-22 LAB — POCT INR: INR: 2.3

## 2014-09-25 ENCOUNTER — Encounter: Payer: Self-pay | Admitting: Internal Medicine

## 2014-09-25 ENCOUNTER — Ambulatory Visit (INDEPENDENT_AMBULATORY_CARE_PROVIDER_SITE_OTHER): Payer: Medicare Other | Admitting: *Deleted

## 2014-09-25 DIAGNOSIS — I495 Sick sinus syndrome: Secondary | ICD-10-CM | POA: Diagnosis not present

## 2014-09-25 NOTE — Progress Notes (Signed)
Remote pacemaker transmission.   

## 2014-09-29 ENCOUNTER — Other Ambulatory Visit: Payer: Self-pay | Admitting: Cardiology

## 2014-09-29 LAB — MDC_IDC_ENUM_SESS_TYPE_REMOTE
Battery Voltage: 2.97 V
Brady Statistic AP VS Percent: 8.81 %
Brady Statistic AS VP Percent: 4.69 %
Brady Statistic AS VS Percent: 71.25 %
Brady Statistic RA Percent Paced: 24.06 %
Brady Statistic RV Percent Paced: 19.94 %
Lead Channel Impedance Value: 432 Ohm
Lead Channel Setting Pacing Amplitude: 2 V
Lead Channel Setting Pacing Amplitude: 2.5 V
Lead Channel Setting Pacing Pulse Width: 0.5 ms
MDC IDC MSMT LEADCHNL RA IMPEDANCE VALUE: 448 Ohm
MDC IDC MSMT LEADCHNL RA SENSING INTR AMPL: 0.348 mV
MDC IDC SESS DTM: 20160411123956
MDC IDC SET LEADCHNL RV SENSING SENSITIVITY: 2.1 mV
MDC IDC STAT BRADY AP VP PERCENT: 15.25 %
Zone Setting Detection Interval: 400 ms
Zone Setting Detection Interval: 400 ms

## 2014-10-02 ENCOUNTER — Telehealth: Payer: Self-pay | Admitting: *Deleted

## 2014-10-02 NOTE — Telephone Encounter (Signed)
Spoke to granddaughter about pt's AF. I informed her that JA wants the pt to f/u w/Amber Seiler,NP. Granddaughter voiced understanding. Will defer scheduling [facility].

## 2014-10-05 ENCOUNTER — Ambulatory Visit (INDEPENDENT_AMBULATORY_CARE_PROVIDER_SITE_OTHER): Payer: Medicare Other | Admitting: *Deleted

## 2014-10-05 DIAGNOSIS — I4891 Unspecified atrial fibrillation: Secondary | ICD-10-CM

## 2014-10-05 LAB — POCT INR: INR: 2.5

## 2014-10-26 ENCOUNTER — Ambulatory Visit (INDEPENDENT_AMBULATORY_CARE_PROVIDER_SITE_OTHER): Payer: Medicare Other

## 2014-10-26 DIAGNOSIS — I4891 Unspecified atrial fibrillation: Secondary | ICD-10-CM | POA: Diagnosis not present

## 2014-10-26 LAB — POCT INR: INR: 3.6

## 2014-10-29 ENCOUNTER — Encounter: Payer: Self-pay | Admitting: Nurse Practitioner

## 2014-10-29 NOTE — Progress Notes (Signed)
Electrophysiology Office Note Date: 10/30/2014  ID:  Cheryl [name], DOB 04-Dec-1926, MRN [id number]  PCP: Johny BlamerHARRIS, WILLIAM, MD Primary Cardiologist: Anne FuSkains Electrophysiologist: Allred  CC: Pacemaker follow-up  Cheryl [name] is a 81 y.o. female is seen today for Dr Johney FrameAllred.  She presents today for electrophysiology followup after increased AF burden on remote device interrogation.  She was seen 08/2014 by Dr Anne FuSkains at which time cardioversion was discussed but not done.    Since being in atrial fibrillation, the patient reports persistent fatigue and palpitations.  She denies chest pain, dyspnea, PND, orthopnea, nausea, vomiting, dizziness, syncope, edema, weight gain, or early satiety.  Device History: MDT dual chamber MRI compatible PPM implanted 2011 for sick sinus syndrome   Past Medical History  Diagnosis Date  . Atrial fibrillation     Prior cardioversion 10/11  . Chronic anticoagulation   . Hyperlipidemia   . Hypertension   . Sick sinus syndrome     Medtronic-10/13 implantation  . CKD (chronic kidney disease), stage III   . Anemia   . Osteoarthritis        . Chronic right hip pain    Past Surgical History  Procedure Laterality Date  . Tonsillectomy    . Abdominal hysterectomy    . Appendectomy    . Cardioversion N/A 04/04/2013    Procedure: CARDIOVERSION;  Surgeon: Donato SchultzMark Skains, MD;  [facility];  Service: Cardiovascular;  Laterality: N/A;  . Total knee arthroplasty Right 09/19/2013  . Hernia repair      "navel"  . Umbilical hernia repair      "I don't have a belly button"  . Cataract extraction w/ intraocular lens  implant, bilateral Bilateral   . Cardioversion  03/2010    /hx medical noted above  . Dilation and curettage of uterus      "had 3 miscarriages"   . Total knee arthroplasty Right 09/19/2013    Procedure: TOTAL KNEE ARTHROPLASTY;  Surgeon: Nestor LewandowskyFrank J Rowan, MD;  Location: [facility] OR;  Service: Orthopedics;  Laterality: Right;  . Pacemaker  insertion  03/29/12    MDT Revo MRI compatable PPM implanted in [place] for SSS    Current Outpatient Prescriptions  Medication Sig Dispense Refill  . acetaminophen (TYLENOL) 500 MG tablet Take 500 mg by mouth every 6 (six) hours as needed for mild pain.     Marland Kitchen. amiodarone (PACERONE) 200 MG tablet Take 1 tablet (200 mg total) by mouth daily. 90 tablet 3  . atorvastatin (LIPITOR) 20 MG tablet Take 1 tablet (20 mg total) by mouth daily. 90 tablet 3  . furosemide (LASIX) 40 MG tablet Take 1 tablet (40 mg total) by mouth 2 (two) times daily. 180 tablet 3  . potassium chloride SA (K-DUR,KLOR-CON) 20 MEQ tablet Take 1 tablet (20 mEq total) by mouth daily. 90 tablet 3  . warfarin (COUMADIN) 2.5 MG tablet Take as directed by Coumadin clinic 30 tablet 3   No current facility-administered medications for this visit.    Allergies:   Demerol   Social History: History   Social History  . Marital Status: Married    Spouse Name: N/A  . Number of Children: N/A  . Years of Education: N/A   Occupational History  . Not on file.   Social History Main Topics  . Smoking status: Former Smoker -- 0.25 packs/day for 40 years    Types: Cigarettes    Quit date: 06/15/1994  . Smokeless tobacco: Never Used  . Alcohol Use: No  .  Drug Use: No  . Sexual Activity: No   Other Topics Concern  . Not on file   Social History Narrative    Family History: Family History  Problem Relation Age of Onset  . Stroke Mother   . Heart Problems Father   . Hypertension Mother     family history  . Diabetes      family history  . CAD Father      Review of Systems: All other systems reviewed and are otherwise negative except as noted above.   Physical Exam: VS:  BP 155/92 mmHg  Pulse 101  Ht 5' 5.5" (1.664 m)  Wt 199 lb 9.6 oz (90.538 kg)  BMI 32.70 kg/m2 , BMI Body mass index is 32.7 kg/(m^2).  GEN- The patient is elderly and obese appearing, alert and oriented x 3 today.   HEENT: normocephalic,  atraumatic; sclera clear, conjunctiva pink; hearing intact; oropharynx clear; neck supple, no JVP Lymph- no cervical lymphadenopathy Lungs- Clear to ausculation bilaterally, normal work of breathing.  No wheezes, rales, rhonchi Heart- Irregular rate and rhythm, no murmurs, rubs or gallops  GI- soft, non-tender, non-distended, bowel sounds present  Extremities- no clubbing, cyanosis, 1+edema  MS- no significant deformity or atrophy Skin- warm and dry, no rash or lesion; PPM pocket well healed Psych- euthymic mood, full affect Neuro- strength and sensation are intact  PPM Interrogation- reviewed in detail today,  See PACEART report  EKG:  EKG is ordered today and demonstrates atrial fibrillation, ventricular rate 101, QRS 86  Recent Labs: No results found for requested labs within last 365 days.   Wt Readings from Last 3 Encounters:  10/30/14 199 lb 9.6 oz (90.538 kg)  09/04/14 192 lb (87.091 kg)  03/15/14 180 lb (81.647 kg)    Assessment and Plan:  1.  Symptomatic bradycardia Normal PPM function See Pace Art report No changes today  2.  Persistent atrial fibrillation The patient had been maintaining SR post cardioversion 03/2013 until recently.  She is symptomatic with her atrial fibrillation.  Will pursue SR with repeat cardioversion.  Continue Warfarin for CHADS2VASC score of at least 4 INR's have been therapeutic for the last 4 weeks Will check amiodarone surveillance labs with pre-cardioversion labs  3.  HTN Stable No change required today  4.  Daytime somnolence/fatigue If not improved post cardioversion, will need to consider sleep study evaluation   Current medicines are reviewed at length with the patient today.   The patient does not have concerns regarding her medicines.  The following changes were made today:  none  Labs/ tests ordered today include:  Orders Placed This Encounter  Procedures  . Basic metabolic panel     Disposition:   Follow up with me  4 weeks after cardioversion - will need to check amiodarone surveillance labs at that time   Signed, Gypsy BalsamAmber Marckus Hanover, NP 10/30/2014 12:12 PM  [facility] [address] [place] [address] [phone] (office) [phone] (fax)

## 2014-10-30 ENCOUNTER — Ambulatory Visit (INDEPENDENT_AMBULATORY_CARE_PROVIDER_SITE_OTHER): Payer: Medicare Other | Admitting: Nurse Practitioner

## 2014-10-30 ENCOUNTER — Encounter: Payer: Self-pay | Admitting: Nurse Practitioner

## 2014-10-30 VITALS — BP 155/92 | HR 101 | Ht 65.5 in | Wt 199.6 lb

## 2014-10-30 DIAGNOSIS — I495 Sick sinus syndrome: Secondary | ICD-10-CM

## 2014-10-30 DIAGNOSIS — I481 Persistent atrial fibrillation: Secondary | ICD-10-CM | POA: Diagnosis not present

## 2014-10-30 DIAGNOSIS — I10 Essential (primary) hypertension: Secondary | ICD-10-CM | POA: Diagnosis not present

## 2014-10-30 DIAGNOSIS — I4819 Other persistent atrial fibrillation: Secondary | ICD-10-CM

## 2014-10-30 LAB — BASIC METABOLIC PANEL
BUN: 25 mg/dL — AB (ref 6–23)
CALCIUM: 9.3 mg/dL (ref 8.4–10.5)
CHLORIDE: 104 meq/L (ref 96–112)
CO2: 29 mEq/L (ref 19–32)
CREATININE: 1.14 mg/dL (ref 0.40–1.20)
GFR: 48 mL/min — AB (ref >60.00)
Glucose, Bld: 107 mg/dL — ABNORMAL HIGH (ref 70–99)
Potassium: 4.2 mEq/L (ref 3.5–5.1)
Sodium: 138 mEq/L (ref 135–145)

## 2014-10-30 NOTE — Patient Instructions (Signed)
Medication Instructions:  Your physician recommends that you continue on your current medications as directed. Please refer to the Current Medication list given to you today.   Labwork: Bmet today  Testing/Procedures: Your physician has recommended that you have a Cardioversion (DCCV). Electrical Cardioversion uses a jolt of electricity to your heart either through paddles or wired patches attached to your chest. This is a controlled, usually prescheduled, procedure. Defibrillation is done under light anesthesia in the hospital, and you usually go home the day of the procedure. This is done to get your heart back into a normal rhythm. You are not awake for the procedure. Please see the instruction sheet given to you today.  Follow-Up: Your physician recommends that you schedule a follow-up appointment in 4 weeks with Amber Seilers,NP   Any Other Special Instructions Will Be Listed Below (If Applicable).

## 2014-11-01 ENCOUNTER — Telehealth: Payer: Self-pay | Admitting: Cardiology

## 2014-11-01 NOTE — Telephone Encounter (Signed)
Left message for Cheryl DoomGeorgie to call back for results.  [facility] and is scheduled for Cardio[facility].  Results are in Triage In basket.

## 2014-11-01 NOTE — Telephone Encounter (Signed)
Follow Up    Returning a call from earlier regarding results. Please call.

## 2014-11-03 NOTE — Telephone Encounter (Signed)
I spoke with the patient's grand daughter- she reports the patient lives with her and does not really make her own decisions. She was already aware the patient is scheduled for DCCV on Monday 5/23. Advised her that the patient's lab were within normal limits from earlier this week.

## 2014-11-06 ENCOUNTER — Ambulatory Visit (HOSPITAL_COMMUNITY): Payer: Medicare Other | Admitting: Anesthesiology

## 2014-11-06 ENCOUNTER — Encounter (HOSPITAL_COMMUNITY): Payer: Self-pay | Admitting: *Deleted

## 2014-11-06 ENCOUNTER — Encounter: Payer: Self-pay | Admitting: Internal Medicine

## 2014-11-06 ENCOUNTER — Encounter (HOSPITAL_COMMUNITY)
Admission: RE | Disposition: A | Discharge status: Home / Self Care | Payer: Self-pay | Source: Ambulatory Visit | Attending: Cardiovascular Disease

## 2014-11-06 ENCOUNTER — Ambulatory Visit (HOSPITAL_COMMUNITY)
Admission: RE | Admit: 2014-11-06 | Discharge: 2014-11-06 | Disposition: A | Discharge status: Home / Self Care | Payer: Medicare Other | Source: Ambulatory Visit | Attending: Cardiovascular Disease | Admitting: Cardiovascular Disease

## 2014-11-06 DIAGNOSIS — Z87891 Personal history of nicotine dependence: Secondary | ICD-10-CM | POA: Diagnosis not present

## 2014-11-06 DIAGNOSIS — M25551 Pain in right hip: Secondary | ICD-10-CM | POA: Insufficient documentation

## 2014-11-06 DIAGNOSIS — R400 Somnolence: Secondary | ICD-10-CM | POA: Insufficient documentation

## 2014-11-06 DIAGNOSIS — I4891 Unspecified atrial fibrillation: Secondary | ICD-10-CM | POA: Diagnosis present

## 2014-11-06 DIAGNOSIS — Z950 Presence of cardiac pacemaker: Secondary | ICD-10-CM | POA: Insufficient documentation

## 2014-11-06 DIAGNOSIS — E785 Hyperlipidemia, unspecified: Secondary | ICD-10-CM | POA: Diagnosis not present

## 2014-11-06 DIAGNOSIS — G8929 Other chronic pain: Secondary | ICD-10-CM | POA: Diagnosis not present

## 2014-11-06 DIAGNOSIS — I481 Persistent atrial fibrillation: Secondary | ICD-10-CM | POA: Insufficient documentation

## 2014-11-06 DIAGNOSIS — Z96651 Presence of right artificial knee joint: Secondary | ICD-10-CM | POA: Diagnosis not present

## 2014-11-06 DIAGNOSIS — Z79899 Other long term (current) drug therapy: Secondary | ICD-10-CM | POA: Insufficient documentation

## 2014-11-06 DIAGNOSIS — I129 Hypertensive chronic kidney disease with stage 1 through stage 4 chronic kidney disease, or unspecified chronic kidney disease: Secondary | ICD-10-CM | POA: Insufficient documentation

## 2014-11-06 DIAGNOSIS — R9431 Abnormal electrocardiogram [ECG] [EKG]: Secondary | ICD-10-CM | POA: Insufficient documentation

## 2014-11-06 DIAGNOSIS — Z7901 Long term (current) use of anticoagulants: Secondary | ICD-10-CM | POA: Insufficient documentation

## 2014-11-06 DIAGNOSIS — M1990 Unspecified osteoarthritis, unspecified site: Secondary | ICD-10-CM | POA: Insufficient documentation

## 2014-11-06 DIAGNOSIS — N183 Chronic kidney disease, stage 3 (moderate): Secondary | ICD-10-CM | POA: Diagnosis not present

## 2014-11-06 DIAGNOSIS — R5383 Other fatigue: Secondary | ICD-10-CM | POA: Diagnosis not present

## 2014-11-06 DIAGNOSIS — I4819 Other persistent atrial fibrillation: Secondary | ICD-10-CM | POA: Insufficient documentation

## 2014-11-06 HISTORY — PX: CARDIOVERSION: SHX1299

## 2014-11-06 LAB — PROTIME-INR
INR: 2.73 — ABNORMAL HIGH (ref 0.00–1.49)
Prothrombin Time: 28.5 seconds — ABNORMAL HIGH (ref 11.6–15.2)

## 2014-11-06 SURGERY — CARDIOVERSION
Anesthesia: Monitor Anesthesia Care

## 2014-11-06 MED ORDER — PROPOFOL 10 MG/ML IV BOLUS
INTRAVENOUS | Status: AC
  Filled 2014-11-06: qty 20

## 2014-11-06 MED ORDER — PROPOFOL 10 MG/ML IV BOLUS
INTRAVENOUS | Status: DC | PRN
  Administered 2014-11-06: 80 mg via INTRAVENOUS

## 2014-11-06 MED ORDER — SODIUM CHLORIDE 0.9 % IV SOLN
INTRAVENOUS | Status: DC
  Administered 2014-11-06: 10:00:00 via INTRAVENOUS
  Administered 2014-11-06: 500 mL via INTRAVENOUS

## 2014-11-06 MED ORDER — LIDOCAINE HCL (CARDIAC) 20 MG/ML IV SOLN
INTRAVENOUS | Status: DC | PRN
  Administered 2014-11-06: 50 mg via INTRAVENOUS

## 2014-11-06 NOTE — Anesthesia Preprocedure Evaluation (Addendum)
Anesthesia Evaluation  Patient identified by MRN, date of birth, ID band Patient awake    Reviewed: Allergy & Precautions, NPO status , Patient's Chart, lab work & pertinent test results  Airway Mallampati: II  TM Distance: >3 FB Neck ROM: Full    Dental  (+) Teeth Intact   Pulmonary shortness of breath, former smoker,    Pulmonary exam normal       Cardiovascular hypertension, Pt. on medications Normal cardiovascular exam    Neuro/Psych    GI/Hepatic   Endo/Other    Renal/GU CRFRenal disease     Musculoskeletal   Abdominal   Peds  Hematology   Anesthesia Other Findings   Reproductive/Obstetrics                           Anesthesia Physical Anesthesia Plan  ASA: III  Anesthesia Plan: MAC   Post-op Pain Management:    Induction: Intravenous  Airway Management Planned: Mask  Additional Equipment:   Intra-op Plan:   Post-operative Plan:   Informed Consent: I have reviewed the patients History and Physical, chart, labs and discussed the procedure including the risks, benefits and alternatives for the proposed anesthesia with the patient or authorized representative who has indicated his/her understanding and acceptance.   Dental advisory given  Plan Discussed with: CRNA and Surgeon  Anesthesia Plan Comments:        Anesthesia Quick Evaluation

## 2014-11-06 NOTE — Anesthesia Postprocedure Evaluation (Signed)
  Anesthesia Post-op Note  Patient: Cheryl [name]  Procedure(s) Performed: Procedure(s): CARDIOVERSION (N/A)  Patient Location: Endoscopy Unit  Anesthesia Type:MAC  Level of Consciousness: awake, alert  and oriented  Airway and Oxygen Therapy: Patient Spontanous Breathing and Patient connected to nasal cannula oxygen  Post-op Pain: none  Post-op Assessment: Post-op Vital signs reviewed, Patient's Cardiovascular Status Stable, Respiratory Function Stable, Patent Airway and No signs of Nausea or vomiting  Post-op Vital Signs: Reviewed and stable  Last Vitals:  Filed Vitals:   11/06/14 1035  BP: 140/69  Pulse: 70  Temp:   Resp: 18    Complications: No apparent anesthesia complications

## 2014-11-06 NOTE — Op Note (Signed)
Procedure: Electrical Cardioversion Indications:  Atrial Fibrillation  Procedure Details:  Consent: Risks of procedure as well as the alternatives and risks of each were explained to the (patient/caregiver).  Consent for procedure obtained.  Time Out: Verified patient identification, verified procedure, site/side was marked, verified correct patient position, special equipment/implants available, medications/allergies/relevent history reviewed, required imaging and test results available.  Performed  Patient placed on cardiac monitor, pulse oximetry, supplemental oxygen as necessary.  Sedation given: propofol 80 mg IV, Dr. Michelle Piperssey Pacer pads placed anterior and posterior chest.  Cardioverted 1 time(s).  Cardioversion with synchronized biphasic 120J shock.  Evaluation: Findings: Post procedure EKG shows: A paced V paced rhythm with PACs Complications: 5 minutes after cardioversion, persistent regular atrial tachycardia at CL 400 ms ensued. Burst atrial pacing at CVL 350 ms and 300 ms was unsuccessful. Overdrive pacing successfully terminated the arrhythmia.  Patient did tolerate procedure well.  Normal device check after DCCV.  Atrial preference pacing and atrial tachy therapies turned on. Lower rate limit increased to 70 bpm, at least until follow up  Time Spent Directly with the Patient:  60 minutes   Cheryl [name] 11/06/2014, 10:34 AM

## 2014-11-06 NOTE — H&P (View-Only) (Signed)
Electrophysiology Office Note Date: 10/30/2014  ID:  Cheryl [name], DOB 04-Dec-1926, MRN [id number]  PCP: Cheryl BlamerHARRIS, WILLIAM, MD Primary Cardiologist: Cheryl [name] Electrophysiologist: Cheryl [name]  CC: Pacemaker follow-up  Cheryl [name] is a 81 y.o. female is seen today for Cheryl [name].  She presents today for electrophysiology followup after increased AF burden on remote device interrogation.  She was seen 08/2014 by Cheryl [name] at which time cardioversion was discussed but not done.    Since being in atrial fibrillation, the patient reports persistent fatigue and palpitations.  She denies chest pain, dyspnea, PND, orthopnea, nausea, vomiting, dizziness, syncope, edema, weight gain, or early satiety.  Device History: MDT dual chamber MRI compatible PPM implanted 2011 for sick sinus syndrome   Past Medical History  Diagnosis Date  . Atrial fibrillation     Prior cardioversion 10/11  . Chronic anticoagulation   . Hyperlipidemia   . Hypertension   . Sick sinus syndrome     Medtronic-10/13 implantation  . CKD (chronic kidney disease), stage III   . Anemia   . Osteoarthritis        . Chronic right hip pain    Past Surgical History  Procedure Laterality Date  . Tonsillectomy    . Abdominal hysterectomy    . Appendectomy    . Cardioversion N/A 04/04/2013    Procedure: CARDIOVERSION;  Surgeon: Cheryl SchultzMark Skains, MD;  [facility];  Service: Cardiovascular;  Laterality: N/A;  . Total knee arthroplasty Right 09/19/2013  . Hernia repair      "navel"  . Umbilical hernia repair      "I don't have a belly button"  . Cataract extraction w/ intraocular lens  implant, bilateral Bilateral   . Cardioversion  03/2010    /hx medical noted above  . Dilation and curettage of uterus      "had 3 miscarriages"   . Total knee arthroplasty Right 09/19/2013    Procedure: TOTAL KNEE ARTHROPLASTY;  Surgeon: Cheryl LewandowskyFrank J Rowan, MD;  Location: [facility] OR;  Service: Orthopedics;  Laterality: Right;  . Pacemaker  insertion  03/29/12    MDT Revo MRI compatable PPM implanted in [place] for SSS    Current Outpatient Prescriptions  Medication Sig Dispense Refill  . acetaminophen (TYLENOL) 500 MG tablet Take 500 mg by mouth every 6 (six) hours as needed for mild pain.     Marland Kitchen. amiodarone (PACERONE) 200 MG tablet Take 1 tablet (200 mg total) by mouth daily. 90 tablet 3  . atorvastatin (LIPITOR) 20 MG tablet Take 1 tablet (20 mg total) by mouth daily. 90 tablet 3  . furosemide (LASIX) 40 MG tablet Take 1 tablet (40 mg total) by mouth 2 (two) times daily. 180 tablet 3  . potassium chloride SA (K-DUR,KLOR-CON) 20 MEQ tablet Take 1 tablet (20 mEq total) by mouth daily. 90 tablet 3  . warfarin (COUMADIN) 2.5 MG tablet Take as directed by Coumadin clinic 30 tablet 3   No current facility-administered medications for this visit.    Allergies:   Demerol   Social History: History   Social History  . Marital Status: Married    Spouse Name: N/A  . Number of Children: N/A  . Years of Education: N/A   Occupational History  . Not on file.   Social History Main Topics  . Smoking status: Former Smoker -- 0.25 packs/day for 40 years    Types: Cigarettes    Quit date: 06/15/1994  . Smokeless tobacco: Never Used  . Alcohol Use: No  .  Drug Use: No  . Sexual Activity: No   Other Topics Concern  . Not on file   Social History Narrative    Family History: Family History  Problem Relation Age of Onset  . Stroke Mother   . Heart Problems Father   . Hypertension Mother     family history  . Diabetes      family history  . CAD Father      Review of Systems: All other systems reviewed and are otherwise negative except as noted above.   Physical Exam: VS:  BP 155/92 mmHg  Pulse 101  Ht 5' 5.5" (1.664 m)  Wt 199 lb 9.6 oz (90.538 kg)  BMI 32.70 kg/m2 , BMI Body mass index is 32.7 kg/(m^2).  GEN- The patient is elderly and obese appearing, alert and oriented x 3 today.   HEENT: normocephalic,  atraumatic; sclera clear, conjunctiva pink; hearing intact; oropharynx clear; neck supple, no JVP Lymph- no cervical lymphadenopathy Lungs- Clear to ausculation bilaterally, normal work of breathing.  No wheezes, rales, rhonchi Heart- Irregular rate and rhythm, no murmurs, rubs or gallops  GI- soft, non-tender, non-distended, bowel sounds present  Extremities- no clubbing, cyanosis, 1+edema  MS- no significant deformity or atrophy Skin- warm and dry, no rash or lesion; PPM pocket well healed Psych- euthymic mood, full affect Neuro- strength and sensation are intact  PPM Interrogation- reviewed in detail today,  See PACEART report  EKG:  EKG is ordered today and demonstrates atrial fibrillation, ventricular rate 101, QRS 86  Recent Labs: No results found for requested labs within last 365 days.   Wt Readings from Last 3 Encounters:  10/30/14 199 lb 9.6 oz (90.538 kg)  09/04/14 192 lb (87.091 kg)  03/15/14 180 lb (81.647 kg)    Assessment and Plan:  1.  Symptomatic bradycardia Normal PPM function See Pace Art report No changes today  2.  Persistent atrial fibrillation The patient had been maintaining SR post cardioversion 03/2013 until recently.  She is symptomatic with her atrial fibrillation.  Will pursue SR with repeat cardioversion.  Continue Warfarin for CHADS2VASC score of at least 4 INR's have been therapeutic for the last 4 weeks Will check amiodarone surveillance labs with pre-cardioversion labs  3.  HTN Stable No change required today  4.  Daytime somnolence/fatigue If not improved post cardioversion, will need to consider sleep study evaluation   Current medicines are reviewed at length with the patient today.   The patient does not have concerns regarding her medicines.  The following changes were made today:  none  Labs/ tests ordered today include:  Orders Placed This Encounter  Procedures  . Basic metabolic panel     Disposition:   Follow up with me  4 weeks after cardioversion - will need to check amiodarone surveillance labs at that time   Signed, Cheryl BalsamAmber Brodric Schauer, NP 10/30/2014 12:12 PM  [facility] [address] [place] [address] [phone] (office) [phone] (fax)

## 2014-11-06 NOTE — Interval H&P Note (Signed)
History and Physical Interval Note:  11/06/2014 8:15 AM  Cheryl [name]  has presented today for surgery, with the diagnosis of AFIB  The various methods of treatment have been discussed with the patient and family. After consideration of risks, benefits and other options for treatment, the patient has consented to  Procedure(s): CARDIOVERSION (N/A) as a surgical intervention .  The patient's history has been reviewed, patient examined, no change in status, stable for surgery.  I have reviewed the patient's chart and labs.  Questions were answered to the patient's satisfaction.     [name]

## 2014-11-06 NOTE — Discharge Instructions (Signed)

## 2014-11-06 NOTE — Anesthesia Procedure Notes (Addendum)
Procedure Name: MAC Date/Time: 11/06/2014 10:20 AM Performed by: Quentin OreWALKER, Sinead Hockman E Pre-anesthesia Checklist: Patient identified, Emergency Drugs available, Suction available, Patient being monitored and Timeout performed Patient Re-evaluated:Patient Re-evaluated prior to inductionOxygen Delivery Method: Simple face mask Preoxygenation: Pre-oxygenation with 100% oxygen Intubation Type: IV induction Ventilation: Mask ventilation without difficulty Dental Injury: Teeth and Oropharynx as per pre-operative assessment

## 2014-11-06 NOTE — Transfer of Care (Signed)
Immediate Anesthesia Transfer of Care Note  Patient: Cheryl [name]  Procedure(s) Performed: Procedure(s): CARDIOVERSION (N/A)  Patient Location: Endoscopy Unit  Anesthesia Type:MAC  Level of Consciousness: awake, alert  and oriented  Airway & Oxygen Therapy: Patient Spontanous Breathing and Patient connected to nasal cannula oxygen  Post-op Assessment: Report given to RN, Post -op Vital signs reviewed and stable and Patient moving all extremities  Post vital signs: Reviewed and stable  Last Vitals:  Filed Vitals:   11/06/14 1035  BP: 140/69  Pulse: 70  Temp:   Resp: 18    Complications: No apparent anesthesia complications

## 2014-11-07 ENCOUNTER — Encounter (HOSPITAL_COMMUNITY): Payer: Self-pay | Admitting: Cardiovascular Disease

## 2014-11-16 ENCOUNTER — Ambulatory Visit (INDEPENDENT_AMBULATORY_CARE_PROVIDER_SITE_OTHER): Payer: Medicare Other | Admitting: *Deleted

## 2014-11-16 DIAGNOSIS — I4891 Unspecified atrial fibrillation: Secondary | ICD-10-CM | POA: Diagnosis not present

## 2014-11-16 LAB — POCT INR: INR: 4

## 2014-11-26 ENCOUNTER — Encounter: Payer: Self-pay | Admitting: Nurse Practitioner

## 2014-11-26 NOTE — Progress Notes (Signed)
Electrophysiology Office Note Date: 11/29/2014  ID:  Cheryl [name], DOB [birth year], MRN [id number]  PCP: Johny Blamer, MD Primary Cardiologist: Anne Fu Electrophysiologist: Allred  CC: Pacemaker follow-up  Cheryl [name] is a 81 y.o. female seen today for Dr Johney Frame.  She presents today for electrophysiology followup.  She was recently found to have increased AF burden on remote pacemaker interrogation.  She underwent cardioversion 11/06/14.  Device interrogation today demonstrates persistent atrial arrhythmias since cardioversion; however, today she is in SR. She denies chest pain, dyspnea, PND, orthopnea, nausea, vomiting, dizziness, syncope.  She reports ongoing fatigue and daytime somnolence.   Device History: MDT dual chamber MRI compatible PPM implanted 2011 for sick sinus syndrome   Past Medical History  Diagnosis Date  . Persistent atrial fibrillation        . Hyperlipidemia   . Hypertension   . Sick sinus syndrome     a. s/p MDT dual chamber PPM  . CKD (chronic kidney disease), stage III   . Anemia   . Osteoarthritis        . Chronic right hip pain    Past Surgical History  Procedure Laterality Date  . Tonsillectomy    . Abdominal hysterectomy    . Appendectomy    . Cardioversion N/A 04/04/2013    Procedure: CARDIOVERSION;  Surgeon: Donato Schultz, MD;  Location: [facility] ENDOSCOPY;  Service: Cardiovascular;  Laterality: N/A;  . Total knee arthroplasty Right 09/19/2013  . Hernia repair      "navel"  . Umbilical hernia repair      "I don't have a belly button"  . Cataract extraction w/ intraocular lens  implant, bilateral Bilateral   . Cardioversion  03/2010    /hx medical noted above  . Dilation and curettage of uterus      "had 3 miscarriages"   . Total knee arthroplasty Right 09/19/2013    Procedure: TOTAL KNEE ARTHROPLASTY;  Surgeon: Nestor Lewandowsky, MD;  Location: [facility] OR;  Service: Orthopedics;  Laterality: Right;  . Pacemaker insertion  03/29/12    MDT  Revo MRI compatable PPM implanted in [place] for SSS  . Cardioversion N/A 11/06/2014    Procedure: CARDIOVERSION;  Surgeon: Thurmon Fair, MD;  Location: [facility] ENDOSCOPY;  Service: Cardiovascular;  Laterality: N/A;    Current Outpatient Prescriptions  Medication Sig Dispense Refill  . acetaminophen (TYLENOL) 500 MG tablet Take 1,000 mg by mouth 2 (two) times daily.     Marland Kitchen amiodarone (PACERONE) 200 MG tablet Take 1 tablet (200 mg total) by mouth daily. 90 tablet 3  . atorvastatin (LIPITOR) 20 MG tablet Take 1 tablet (20 mg total) by mouth daily. 90 tablet 3  . furosemide (LASIX) 40 MG tablet Take 1 tablet (40 mg total) by mouth 2 (two) times daily. 180 tablet 3  . potassium chloride SA (K-DUR,KLOR-CON) 20 MEQ tablet Take 1 tablet (20 mEq total) by mouth daily. 90 tablet 3  . warfarin (COUMADIN) 2.5 MG tablet Take as directed by Coumadin clinic (Patient taking differently: Take 1.25-2.5 mg by mouth See admin instructions. Pt takes 1.25 on Saturdays, and takes 2.5mg  all other days) 30 tablet 3   No current facility-administered medications for this visit.    Allergies:   Demerol   Social History: History   Social History  . Marital Status: Married    Spouse Name: N/A  . Number of Children: N/A  . Years of Education: N/A   Occupational History  . Not on file.   Social  History Main Topics  . Smoking status: Former Smoker -- 0.25 packs/day for 40 years    Types: Cigarettes    Quit date: 06/15/1994  . Smokeless tobacco: Never Used  . Alcohol Use: No  . Drug Use: No  . Sexual Activity: No   Other Topics Concern  . Not on file   Social History Narrative    Family History: Family History  Problem Relation Age of Onset  . Stroke Mother   . Heart Problems Father   . Hypertension Mother     family history  . Diabetes      family history  . CAD Father      Review of Systems: All other systems reviewed and are otherwise negative except as noted above.   Physical  Exam: VS:  BP 124/70 mmHg  Pulse 71  Ht 5' 5.5" (1.664 m)  Wt 204 lb (92.534 kg)  BMI 33.42 kg/m2 , BMI Body mass index is 33.42 kg/(m^2).  GEN- The patient is elderly and obese appearing, alert and oriented x 3 today.   HEENT: normocephalic, atraumatic; sclera clear, conjunctiva pink; hearing intact; oropharynx clear; neck supple, no JVP Lymph- no cervical lymphadenopathy Lungs- Clear to ausculation bilaterally, normal work of breathing.  No wheezes, rales, rhonchi Heart- Regular rate and rhythm, no murmurs, rubs or gallops  GI- soft, non-tender, non-distended, bowel sounds present  Extremities- no clubbing, cyanosis, 1+edema  MS- no significant deformity or atrophy Skin- warm and dry, no rash or lesion; PPM pocket well healed Psych- euthymic mood, full affect Neuro- strength and sensation are intact  PPM Interrogation- reviewed in detail today,  See PACEART report  EKG:  EKG is ordered today and demonstrates atrial pacing with intrinsic ventricular conduction  Recent Labs: 10/30/2014: BUN 25*; Creatinine, Ser 1.14; Potassium 4.2; Sodium 138   Wt Readings from Last 3 Encounters:  11/29/14 204 lb (92.534 kg)  10/30/14 199 lb 9.6 oz (90.538 kg)  09/04/14 192 lb (87.091 kg)    Assessment and Plan:  1.  Symptomatic bradycardia Normal PPM function See Pace Art report No changes today  2.  Persistent atrial fibrillation In SR today, but with recurrent atrial arrhythmias post cardioversion Continue Warfarin for CHADS2VASC score of at least 4 Remote transmission next week to evaluate AF burden.  If AF burden persists, will increase Amiodarone to 200mg  twice daily and repeat cardioversion 4 weeks later  3.  HTN Stable No change required today  4.  Daytime somnolence/fatigue She and her granddaughter decline sleep study evaluation today   Current medicines are reviewed at length with the patient today.   The patient does not have concerns regarding her medicines.  The  following changes were made today:  none  Labs/ tests ordered today include: none   Disposition:   Follow up with Dr Anne Fu as scheduled, [facility], follow up Dr Johney Frame as scheduled   Signed, Gypsy Balsam, NP 11/29/2014 4:37 PM  [facility] [address] [place] [address] [phone] (office) [phone] (fax)

## 2014-11-29 ENCOUNTER — Ambulatory Visit (INDEPENDENT_AMBULATORY_CARE_PROVIDER_SITE_OTHER): Payer: Medicare Other | Admitting: Nurse Practitioner

## 2014-11-29 ENCOUNTER — Encounter: Payer: Self-pay | Admitting: Nurse Practitioner

## 2014-11-29 ENCOUNTER — Ambulatory Visit (INDEPENDENT_AMBULATORY_CARE_PROVIDER_SITE_OTHER): Payer: Medicare Other | Admitting: *Deleted

## 2014-11-29 VITALS — BP 124/70 | HR 71 | Ht 65.5 in | Wt 204.0 lb

## 2014-11-29 DIAGNOSIS — Z950 Presence of cardiac pacemaker: Secondary | ICD-10-CM

## 2014-11-29 DIAGNOSIS — I4891 Unspecified atrial fibrillation: Secondary | ICD-10-CM

## 2014-11-29 DIAGNOSIS — I481 Persistent atrial fibrillation: Secondary | ICD-10-CM | POA: Diagnosis not present

## 2014-11-29 DIAGNOSIS — I4819 Other persistent atrial fibrillation: Secondary | ICD-10-CM

## 2014-11-29 DIAGNOSIS — I495 Sick sinus syndrome: Secondary | ICD-10-CM

## 2014-11-29 DIAGNOSIS — Z5181 Encounter for therapeutic drug level monitoring: Secondary | ICD-10-CM

## 2014-11-29 DIAGNOSIS — I10 Essential (primary) hypertension: Secondary | ICD-10-CM

## 2014-11-29 DIAGNOSIS — G4710 Hypersomnia, unspecified: Secondary | ICD-10-CM | POA: Diagnosis not present

## 2014-11-29 DIAGNOSIS — R400 Somnolence: Secondary | ICD-10-CM

## 2014-11-29 LAB — POCT INR: INR: 2.4

## 2014-11-29 NOTE — Patient Instructions (Signed)
Your physician recommends that you continue on your current medications as directed. Please refer to the Current Medication list given to you today.  Please send a remote transmission next Wed, December 06, 2014.  We will call you after we review it.  Keep your upcoming appointment with Dr. Anne Fu.  Your physician recommends that you schedule a follow-up appointment in: 3 months with Dr. Johney Frame.

## 2014-11-30 ENCOUNTER — Ambulatory Visit (HOSPITAL_COMMUNITY): Payer: Medicare Other | Admitting: Nurse Practitioner

## 2014-12-06 ENCOUNTER — Ambulatory Visit (INDEPENDENT_AMBULATORY_CARE_PROVIDER_SITE_OTHER): Payer: Medicare Other | Admitting: *Deleted

## 2014-12-06 ENCOUNTER — Encounter: Payer: Self-pay | Admitting: Internal Medicine

## 2014-12-06 ENCOUNTER — Telehealth: Payer: Self-pay | Admitting: Cardiology

## 2014-12-06 DIAGNOSIS — I495 Sick sinus syndrome: Secondary | ICD-10-CM

## 2014-12-06 NOTE — Telephone Encounter (Signed)
Confirmed remote transmission w/ pt granddaughter.   

## 2014-12-07 LAB — CUP PACEART REMOTE DEVICE CHECK
Battery Voltage: 2.99 V
Brady Statistic AP VP Percent: 1.43 %
Brady Statistic AS VP Percent: 4.28 %
Brady Statistic AS VS Percent: 52.24 %
Brady Statistic RA Percent Paced: 43.49 %
Brady Statistic RV Percent Paced: 5.71 %
Lead Channel Setting Pacing Amplitude: 2.5 V
Lead Channel Setting Sensing Sensitivity: 2.1 mV
MDC IDC MSMT LEADCHNL RA IMPEDANCE VALUE: 480 Ohm
MDC IDC MSMT LEADCHNL RV IMPEDANCE VALUE: 440 Ohm
MDC IDC SESS DTM: 20160622152648
MDC IDC SET LEADCHNL RA PACING AMPLITUDE: 2 V
MDC IDC SET LEADCHNL RV PACING PULSEWIDTH: 0.5 ms
MDC IDC STAT BRADY AP VS PERCENT: 42.06 %
Zone Setting Detection Interval: 400 ms
Zone Setting Detection Interval: 450 ms

## 2014-12-07 NOTE — Progress Notes (Signed)
Remote pacemaker transmission.   

## 2014-12-08 ENCOUNTER — Telehealth: Payer: Self-pay | Admitting: *Deleted

## 2014-12-08 DIAGNOSIS — I4891 Unspecified atrial fibrillation: Secondary | ICD-10-CM

## 2014-12-08 MED ORDER — AMIODARONE HCL 200 MG PO TABS: 200.0000 mg | ORAL_TABLET | Freq: Two times a day (BID) | ORAL | Status: DC

## 2014-12-08 NOTE — Telephone Encounter (Signed)
Spoke with patient's granddaughter- notified of increased AF burden since OV with Amber. Instructed to increase Amiodarone 200mg  from once daily to twice daily per Gypsy Balsam, NP . Will send in new Rx- pharmacy confirmed. She verbalized understanding. Will plan to recheck burden during appt with Dr. Anne Fu 12/12/14 and follow remotely.

## 2014-12-11 ENCOUNTER — Encounter: Payer: Self-pay | Admitting: Cardiology

## 2014-12-12 ENCOUNTER — Encounter: Payer: Self-pay | Admitting: Cardiology

## 2014-12-12 ENCOUNTER — Ambulatory Visit (INDEPENDENT_AMBULATORY_CARE_PROVIDER_SITE_OTHER): Payer: Medicare Other | Admitting: Cardiology

## 2014-12-12 ENCOUNTER — Ambulatory Visit (INDEPENDENT_AMBULATORY_CARE_PROVIDER_SITE_OTHER): Payer: Medicare Other

## 2014-12-12 VITALS — BP 140/80 | HR 96 | Ht 65.5 in | Wt 200.0 lb

## 2014-12-12 DIAGNOSIS — I482 Chronic atrial fibrillation, unspecified: Secondary | ICD-10-CM

## 2014-12-12 DIAGNOSIS — I4891 Unspecified atrial fibrillation: Secondary | ICD-10-CM

## 2014-12-12 DIAGNOSIS — Z79899 Other long term (current) drug therapy: Secondary | ICD-10-CM | POA: Diagnosis not present

## 2014-12-12 DIAGNOSIS — Z950 Presence of cardiac pacemaker: Secondary | ICD-10-CM

## 2014-12-12 DIAGNOSIS — Z5181 Encounter for therapeutic drug level monitoring: Secondary | ICD-10-CM

## 2014-12-12 LAB — POCT INR: INR: 2.6

## 2014-12-12 NOTE — Patient Instructions (Signed)
Medication Instructions:  The current medical regimen is effective;  continue present plan and medications.  Labwork: Please have blood work today (CBC,CMP,TSH and Free T4)  Follow-Up: Follow up in 2 weeks with Dr Anne FuSkains  Thank you [facility] choosing [facility]!!

## 2014-12-12 NOTE — Progress Notes (Signed)
[address] [place], [place]  [address] Phone: [phone](336) [phone] Fax:  [phone](336) [phone]  Date:  12/12/2014   ID:  Cheryl [name], DOB 06-Mar-1927, MRN [id number]  PCP:  Johny BlamerHARRIS, WILLIAM, MD   History of Present Illness: Cheryl [name] is a 81 y.o. female with atrial fibrillation/flutter on chronic anticoagulation, pacemaker, hypertension, hyperlipidemia, chronic amiodarone use here for followup.  She has a Medtronic pacemaker implanted in October of 2013. She is relatively new to the area 8/14. Prior myocardial infarction, prior stroke. LDL 91 on 10/21/12. Hemoglobin 12.9. TSH normal. BNP 787 July of 2014. Prior cardioversion October of 2011. EKG previously had shown an ectopic atrial rhythm with rate of 55.  After prior cardioversion went back into fib.   After pacemaker check, she has been in chronic atrial fibrillation/flutter since mid June of 2014. Prior to that, she seemed to be in paroxysmal atrial fibrillation. She has been on amiodarone for quite some time. She had cardioversion on 04/04/13. She had a cardioversion after being on amiodarone in [place].   She saw Dr. Johney FrameAllred on 02/27/14 and was complaining of sensation of racing heartbeat. Her pacemaker interrogation showed normal function. No tachycardia.   After seeing EP clinic on 11/26/14-amiodarone was increased because of increased atrial fibrillation burden. 200 mg twice a day. She feels tired. Fatigue.  Wt Readings from Last 3 Encounters:  12/12/14 200 lb (90.719 kg)  11/29/14 204 lb (92.534 kg)  10/30/14 199 lb 9.6 oz (90.538 kg)     Past Medical History  Diagnosis Date  . Persistent atrial fibrillation        . Hyperlipidemia   . Hypertension   . Sick sinus syndrome     a. s/p MDT dual chamber PPM  . CKD (chronic kidney disease), stage III   . Anemia   . Osteoarthritis        . Chronic right hip pain     Past Surgical History  Procedure Laterality Date  . Tonsillectomy    . Abdominal  hysterectomy    . Appendectomy    . Cardioversion N/A 04/04/2013    Procedure: CARDIOVERSION;  Surgeon: Donato SchultzMark Skains, MD;  [facility];  Service: Cardiovascular;  Laterality: N/A;  . Total knee arthroplasty Right 09/19/2013  . Hernia repair      "navel"  . Umbilical hernia repair      "I don't have a belly button"  . Cataract extraction w/ intraocular lens  implant, bilateral Bilateral   . Cardioversion  03/2010    /hx medical noted above  . Dilation and curettage of uterus      "had 3 miscarriages"   . Total knee arthroplasty Right 09/19/2013    Procedure: TOTAL KNEE ARTHROPLASTY;  Surgeon: Nestor LewandowskyFrank J Rowan, MD;  Location: [facility] OR;  Service: Orthopedics;  Laterality: Right;  . Pacemaker insertion  03/29/12    MDT Revo MRI compatable PPM implanted in [place] for SSS  . Cardioversion N/A 11/06/2014    Procedure: CARDIOVERSION;  Surgeon: Thurmon FairMihai Croitoru, MD;  Location: [facility] ENDOSCOPY;  Service: Cardiovascular;  Laterality: N/A;    Current Outpatient Prescriptions  Medication Sig Dispense Refill  . acetaminophen (TYLENOL) 500 MG tablet Take 1,000 mg by mouth 2 (two) times daily.     Marland Kitchen. amiodarone (PACERONE) 200 MG tablet Take 1 tablet (200 mg total) by mouth 2 (two) times daily. 180 tablet 3  . atorvastatin (LIPITOR) 20 MG tablet Take 1 tablet (20 mg total) by mouth  daily. 90 tablet 3  . furosemide (LASIX) 40 MG tablet Take 1 tablet (40 mg total) by mouth 2 (two) times daily. 180 tablet 3  . potassium chloride SA (K-DUR,KLOR-CON) 20 MEQ tablet Take 1 tablet (20 mEq total) by mouth daily. 90 tablet 3  . warfarin (COUMADIN) 2.5 MG tablet Take as directed by Coumadin clinic (Patient taking differently: Take 1.25-2.5 mg by mouth See admin instructions. Pt takes 1.25 on Saturdays, and takes 2.5mg  all other days) 30 tablet 3   No current facility-administered medications for this visit.    Allergies:    Allergies  Allergen Reactions  . Demerol [Meperidine] Nausea And Vomiting    Social  History:  The patient  reports that she quit smoking about 20 years ago. Her smoking use included Cigarettes. She has a 10 pack-year smoking history. She has never used smokeless tobacco. She reports that she does not drink alcohol or use illicit drugs.   ROS:  Please see the history of present illness.   No CP, no SOB. Had fall. Right knee swelling.   PHYSICAL EXAM: VS:  BP 140/80 mmHg  Pulse 96  Ht 5' 5.5" (1.664 m)  Wt 200 lb (90.719 kg)  BMI 32.76 kg/m2  SpO2 96% Well nourished, well developed, in no acute distress HEENT: normal Neck: no JVD Cardiac:  Irregular normal rate; 3/6 SEM murmur radiation to carotids.  Lungs:  clear to auscultation bilaterally, no wheezing, rhonchi or rales Abd: soft, nontender, no hepatomegaly Ext: trace LE edemaright knee effusion Skin: warm and dry Neuro: no focal abnormalities noted  EKG:  None today. Prior cardioversion.    Echocardiogram: 2015  - Left ventricle: The cavity size was normal. Wall thickness was increased in a pattern of mild LVH. Systolic function was normal. The estimated ejection fraction was in the range of 60% to 65%. Wall motion was normal; there were no regional wall motion abnormalities. - Aortic valve: Valve mobility was moderately restricted. There was moderate stenosis. There was mild regurgitation. - Mitral valve: There was mild regurgitation. - Left atrium: The atrium was moderately dilated.  ASSESSMENT AND PLAN:  1. Atrial flutter/fibrillation-s/p cardioversion 03/2013. Has been maintaining sinus rhythm fairly well up until recently. Amiodarone has been increased to 200 mg twice a day. We will give her some time on this increase. We'll check a TSH, free T4, CMET and CBC. We will coordinate our efforts with pacemaker clinic. Try to determine what the next best step will be.  2. Chronic anticoagulation-Well monitored.  3. Pacemaker - prior notes reviewed.  4. Weight gain- No overt signs of volume overload.  Minor LE edema.   Signed, Donato Schultz, MD [facility]  12/12/2014 4:43 PM

## 2014-12-13 LAB — COMPREHENSIVE METABOLIC PANEL
ALK PHOS: 119 U/L — AB (ref 39–117)
ALT: 11 U/L (ref 0–35)
AST: 18 U/L (ref 0–37)
Albumin: 3.9 g/dL (ref 3.5–5.2)
BUN: 34 mg/dL — ABNORMAL HIGH (ref 6–23)
CO2: 27 meq/L (ref 19–32)
Calcium: 9.4 mg/dL (ref 8.4–10.5)
Chloride: 106 mEq/L (ref 96–112)
Creatinine, Ser: 1.28 mg/dL — ABNORMAL HIGH (ref 0.40–1.20)
GFR: 41.99 mL/min — ABNORMAL LOW (ref >60.00)
Glucose, Bld: 83 mg/dL (ref 70–99)
Potassium: 4.7 mEq/L (ref 3.5–5.1)
SODIUM: 142 meq/L (ref 135–145)
Total Bilirubin: 0.6 mg/dL (ref 0.2–1.2)
Total Protein: 7.3 g/dL (ref 6.0–8.3)

## 2014-12-13 LAB — T4, FREE: Free T4: 1.34 ng/dL (ref 0.60–1.60)

## 2014-12-13 LAB — CBC
HCT: 44.1 % (ref 36.0–46.0)
Hemoglobin: 14.1 g/dL (ref 12.0–15.0)
MCHC: 32.1 g/dL (ref 30.0–36.0)
MCV: 89 fl (ref 78.0–100.0)
Platelets: 301 10*3/uL (ref 150.0–400.0)
RBC: 4.95 Mil/uL (ref 3.87–5.11)
RDW: 16.4 % — ABNORMAL HIGH (ref 11.5–15.5)
WBC: 6.9 10*3/uL (ref 4.0–10.5)

## 2014-12-13 LAB — TSH: TSH: 1.39 u[IU]/mL (ref 0.35–4.50)

## 2014-12-22 ENCOUNTER — Encounter: Payer: Self-pay | Admitting: Internal Medicine

## 2014-12-25 ENCOUNTER — Encounter: Payer: Self-pay | Admitting: Cardiology

## 2014-12-25 ENCOUNTER — Ambulatory Visit (INDEPENDENT_AMBULATORY_CARE_PROVIDER_SITE_OTHER): Payer: Medicare Other | Admitting: Cardiology

## 2014-12-25 VITALS — BP 128/88 | HR 109 | Ht 66.0 in | Wt 201.2 lb

## 2014-12-25 DIAGNOSIS — I482 Chronic atrial fibrillation, unspecified: Secondary | ICD-10-CM

## 2014-12-25 DIAGNOSIS — Z950 Presence of cardiac pacemaker: Secondary | ICD-10-CM

## 2014-12-25 DIAGNOSIS — Z7901 Long term (current) use of anticoagulants: Secondary | ICD-10-CM | POA: Diagnosis not present

## 2014-12-25 NOTE — Progress Notes (Signed)
[address] [place], [place]  [address] Phone: [phone](336) [phone] Fax:  [phone]) [phone]  Date:  12/25/2014   ID:  Cheryl StandingMartha J Rudzinski, DOB [birth year], MRN [id number]  PCP:  Johny BlamerHARRIS, WILLIAM, MD   History of Present Illness: Cheryl [name] is a 81 y.o. female with atrial fibrillation/flutter on chronic anticoagulation, pacemaker, hypertension, hyperlipidemia, chronic amiodarone use here for followup.  She has a Medtronic pacemaker implanted in October of 2013. She is relatively new to the area 8/14. Prior myocardial infarction, prior stroke. LDL 91 on 10/21/12. Hemoglobin 12.9. TSH normal. BNP 787 July of 2014. Prior cardioversion October of 2011. EKG previously had shown an ectopic atrial rhythm with rate of 55.  After prior cardioversion went back into fib.   After pacemaker check, she has been in chronic atrial fibrillation/flutter since mid June of 2014. Prior to that, she seemed to be in paroxysmal atrial fibrillation. She has been on amiodarone for quite some time. She had cardioversion on 04/04/13. She had a cardioversion after being on amiodarone in [place].   She saw Dr. Johney FrameAllred on 02/27/14 and was complaining of sensation of racing heartbeat. Her pacemaker interrogation showed normal function. No tachycardia.   After seeing EP clinic on 11/26/14-amiodarone was increased because of increased atrial fibrillation burden. 200 mg twice a day. She feels tired. Fatigue.  12/25/14-has been on increase amiodarone 200 mg twice a day. Still is in atrial fibrillation on physical exam. She does feel a little bit better however. Her daughter asked how many times we would try cardioversion again. My thoughts are that if she needs cardioversion we would likely go 1 more attempt on the higher dose amiodarone and then after that focus on rate control.   Wt Readings from Last 3 Encounters:  12/25/14 201 lb 3.2 oz (91.264 kg)  12/12/14 200 lb (90.719 kg)  11/29/14 204 lb (92.534 kg)      Past Medical History  Diagnosis Date  . Persistent atrial fibrillation        . Hyperlipidemia   . Hypertension   . Sick sinus syndrome     a. s/p MDT dual chamber PPM  . CKD (chronic kidney disease), stage III   . Anemia   . Osteoarthritis        . Chronic right hip pain     Past Surgical History  Procedure Laterality Date  . Tonsillectomy    . Abdominal hysterectomy    . Appendectomy    . Cardioversion N/A 04/04/2013    Procedure: CARDIOVERSION;  Surgeon: Donato SchultzMark Kijuan Gallicchio, MD;  [facility];  Service: Cardiovascular;  Laterality: N/A;  . Total knee arthroplasty Right 09/19/2013  . Hernia repair      "navel"  . Umbilical hernia repair      "I don't have a belly button"  . Cataract extraction w/ intraocular lens  implant, bilateral Bilateral   . Cardioversion  03/2010    /hx medical noted above  . Dilation and curettage of uterus      "had 3 miscarriages"   . Total knee arthroplasty Right 09/19/2013    Procedure: TOTAL KNEE ARTHROPLASTY;  Surgeon: Nestor LewandowskyFrank J Rowan, MD;  Location: [facility] OR;  Service: Orthopedics;  Laterality: Right;  . Pacemaker insertion  03/29/12    MDT Revo MRI compatable PPM implanted in [place] for SSS  . Cardioversion N/A 11/06/2014    Procedure: CARDIOVERSION;  Surgeon: Thurmon FairMihai Croitoru, MD;  Location: [facility] ENDOSCOPY;  Service: Cardiovascular;  Laterality: N/A;  Current Outpatient Prescriptions  Medication Sig Dispense Refill  . acetaminophen (TYLENOL) 500 MG tablet Take 1,000 mg by mouth 2 (two) times daily.     Marland Kitchen amiodarone (PACERONE) 200 MG tablet Take 1 tablet (200 mg total) by mouth 2 (two) times daily. 180 tablet 3  . atorvastatin (LIPITOR) 20 MG tablet Take 1 tablet (20 mg total) by mouth daily. 90 tablet 3  . furosemide (LASIX) 40 MG tablet Take 1 tablet (40 mg total) by mouth 2 (two) times daily. 180 tablet 3  . potassium chloride SA (K-DUR,KLOR-CON) 20 MEQ tablet Take 1 tablet (20 mEq total) by mouth daily. 90 tablet 3  . warfarin  (COUMADIN) 2.5 MG tablet Take as directed by Coumadin clinic (Patient taking differently: Take 1.25-2.5 mg by mouth See admin instructions. Pt takes 1.25 on Saturdays, and takes 2.5mg  all other days) 30 tablet 3   No current facility-administered medications for this visit.    Allergies:    Allergies  Allergen Reactions  . Demerol [Meperidine] Nausea And Vomiting    Social History:  The patient  reports that she quit smoking about 20 years ago. Her smoking use included Cigarettes. She has a 10 pack-year smoking history. She has never used smokeless tobacco. She reports that she does not drink alcohol or use illicit drugs.   ROS:  Please see the history of present illness.  Positive for leg swelling, shortness of breath when laying down, shortness of breath with activity, back pain, muscle pain, easy bruising, joint swelling, balance and walking issues, anxiety, irregular heartbeats, excessive fatigue, chest pressure  PHYSICAL EXAM: VS:  BP 128/88 mmHg  Pulse 109  Ht  (1.676 m)  Wt 201 lb 3.2 oz (91.264 kg)  BMI 32.49 kg/m2  SpO2 97% Well nourished, well developed, in no acute distress HEENT: normal Neck: no JVD Cardiac:  Irregular normal rate; 3/6 SEM murmur radiation to carotids.  Lungs:  clear to auscultation bilaterally, no wheezing, rhonchi or rales Abd: soft, nontender, no hepatomegaly Ext: no LE edemaright knee effusion Skin: warm and dry Neuro: no focal abnormalities noted  EKG:  None today. Prior cardioversion.    Echocardiogram: 2015  - Left ventricle: The cavity size was normal. Wall thickness was increased in a pattern of mild LVH. Systolic function was normal. The estimated ejection fraction was in the range of 60% to 65%. Wall motion was normal; there were no regional wall motion abnormalities. - Aortic valve: Valve mobility was moderately restricted. There was moderate stenosis. There was mild regurgitation. - Mitral valve: There was mild  regurgitation. - Left atrium: The atrium was moderately dilated.  ASSESSMENT AND PLAN:  1. Atrial flutter/fibrillation-s/p cardioversion 03/2013. Has been maintaining sinus rhythm fairly well up until recently. Amiodarone has been increased to 200 mg twice a day. We will give her some time on this increase. We will coordinate our efforts with pacemaker clinic. I would like to give her some extra time on the higher dose of amiodarone to ensure full load. Check with pacemaker burden. Consider cardioversion in 2-3 months. This would likely be the last attempt at cardioversion.  2. Chronic anticoagulation-Well monitored.  3. Pacemaker - prior notes reviewed.  4. Weight gain- No overt signs of volume overload. no LE edema.   Signed, Donato Schultz, MD [facility]  12/25/2014 1:43 PM

## 2014-12-25 NOTE — Patient Instructions (Signed)
Medication Instructions:  Your physician recommends that you continue on your current medications as directed. Please refer to the Current Medication list given to you today.  Follow-Up: Follow up in 3 months with Dr. Anne FuSkains.  You will receive a letter in the mail 2 months before you are due.  Please call us when you receive this letter to schedule your follow up appointment.  Thank you for choosing [facility]!!

## 2014-12-26 ENCOUNTER — Encounter: Payer: Self-pay | Admitting: Internal Medicine

## 2014-12-26 ENCOUNTER — Ambulatory Visit (INDEPENDENT_AMBULATORY_CARE_PROVIDER_SITE_OTHER): Payer: Medicare Other | Admitting: *Deleted

## 2014-12-26 DIAGNOSIS — I495 Sick sinus syndrome: Secondary | ICD-10-CM | POA: Diagnosis not present

## 2014-12-26 NOTE — Progress Notes (Signed)
Remote pacemaker transmission.   

## 2015-01-02 ENCOUNTER — Ambulatory Visit (INDEPENDENT_AMBULATORY_CARE_PROVIDER_SITE_OTHER): Payer: Medicare Other | Admitting: Pharmacist

## 2015-01-02 DIAGNOSIS — Z5181 Encounter for therapeutic drug level monitoring: Secondary | ICD-10-CM | POA: Diagnosis not present

## 2015-01-02 DIAGNOSIS — I4891 Unspecified atrial fibrillation: Secondary | ICD-10-CM

## 2015-01-02 LAB — CUP PACEART REMOTE DEVICE CHECK
Brady Statistic AP VP Percent: 14.32 %
Brady Statistic AS VP Percent: 12.68 %
Brady Statistic RV Percent Paced: 27 %
Date Time Interrogation Session: 20160712114323
Lead Channel Impedance Value: 480 Ohm
Lead Channel Impedance Value: 496 Ohm
Lead Channel Sensing Intrinsic Amplitude: 19.439 mV
Lead Channel Setting Sensing Sensitivity: 2.1 mV
MDC IDC MSMT BATTERY VOLTAGE: 2.96 V
MDC IDC MSMT LEADCHNL RA SENSING INTR AMPL: 0.391 mV
MDC IDC SET LEADCHNL RA PACING AMPLITUDE: 2 V
MDC IDC SET LEADCHNL RV PACING AMPLITUDE: 2.5 V
MDC IDC SET LEADCHNL RV PACING PULSEWIDTH: 0.5 ms
MDC IDC STAT BRADY AP VS PERCENT: 3.19 %
MDC IDC STAT BRADY AS VS PERCENT: 69.81 %
MDC IDC STAT BRADY RA PERCENT PACED: 17.51 %
Zone Setting Detection Interval: 400 ms
Zone Setting Detection Interval: 450 ms

## 2015-01-02 LAB — POCT INR: INR: 3.3

## 2015-01-17 ENCOUNTER — Ambulatory Visit (INDEPENDENT_AMBULATORY_CARE_PROVIDER_SITE_OTHER): Payer: Medicare Other | Admitting: Pharmacist

## 2015-01-17 DIAGNOSIS — I4891 Unspecified atrial fibrillation: Secondary | ICD-10-CM | POA: Diagnosis not present

## 2015-01-17 DIAGNOSIS — Z5181 Encounter for therapeutic drug level monitoring: Secondary | ICD-10-CM

## 2015-01-17 LAB — POCT INR: INR: 3.6

## 2015-01-19 ENCOUNTER — Encounter: Payer: Self-pay | Admitting: Cardiology

## 2015-01-31 ENCOUNTER — Ambulatory Visit (INDEPENDENT_AMBULATORY_CARE_PROVIDER_SITE_OTHER): Payer: Medicare Other | Admitting: *Deleted

## 2015-01-31 DIAGNOSIS — I4891 Unspecified atrial fibrillation: Secondary | ICD-10-CM

## 2015-01-31 DIAGNOSIS — Z5181 Encounter for therapeutic drug level monitoring: Secondary | ICD-10-CM

## 2015-01-31 LAB — POCT INR: INR: 3.4

## 2015-02-14 ENCOUNTER — Ambulatory Visit (INDEPENDENT_AMBULATORY_CARE_PROVIDER_SITE_OTHER): Payer: Medicare Other | Admitting: *Deleted

## 2015-02-14 DIAGNOSIS — Z5181 Encounter for therapeutic drug level monitoring: Secondary | ICD-10-CM

## 2015-02-14 DIAGNOSIS — I4891 Unspecified atrial fibrillation: Secondary | ICD-10-CM

## 2015-02-14 LAB — POCT INR: INR: 2.4

## 2015-02-23 ENCOUNTER — Other Ambulatory Visit: Payer: Self-pay | Admitting: Cardiology

## 2015-03-05 ENCOUNTER — Encounter: Payer: Self-pay | Admitting: Internal Medicine

## 2015-03-05 ENCOUNTER — Ambulatory Visit (INDEPENDENT_AMBULATORY_CARE_PROVIDER_SITE_OTHER): Payer: Medicare Other | Admitting: Internal Medicine

## 2015-03-05 ENCOUNTER — Ambulatory Visit (INDEPENDENT_AMBULATORY_CARE_PROVIDER_SITE_OTHER): Payer: Medicare Other | Admitting: *Deleted

## 2015-03-05 VITALS — BP 120/88 | HR 92 | Ht 66.0 in | Wt 190.8 lb

## 2015-03-05 DIAGNOSIS — Z5181 Encounter for therapeutic drug level monitoring: Secondary | ICD-10-CM

## 2015-03-05 DIAGNOSIS — I4891 Unspecified atrial fibrillation: Secondary | ICD-10-CM | POA: Diagnosis not present

## 2015-03-05 DIAGNOSIS — Z950 Presence of cardiac pacemaker: Secondary | ICD-10-CM | POA: Diagnosis not present

## 2015-03-05 DIAGNOSIS — I495 Sick sinus syndrome: Secondary | ICD-10-CM | POA: Diagnosis not present

## 2015-03-05 DIAGNOSIS — I482 Chronic atrial fibrillation, unspecified: Secondary | ICD-10-CM

## 2015-03-05 DIAGNOSIS — I119 Hypertensive heart disease without heart failure: Secondary | ICD-10-CM | POA: Diagnosis not present

## 2015-03-05 LAB — CUP PACEART INCLINIC DEVICE CHECK
Battery Voltage: 2.96 V
Brady Statistic AP VS Percent: 5.78 %
Brady Statistic RA Percent Paced: 17.67 %
Brady Statistic RV Percent Paced: 22.85 %
Date Time Interrogation Session: 20160919131647
Lead Channel Impedance Value: 496 Ohm
Lead Channel Pacing Threshold Amplitude: 1 V
Lead Channel Pacing Threshold Pulse Width: 0.4 ms
Lead Channel Sensing Intrinsic Amplitude: 0.6086
Lead Channel Sensing Intrinsic Amplitude: 19.4392
Lead Channel Setting Pacing Amplitude: 2 V
MDC IDC MSMT LEADCHNL RA IMPEDANCE VALUE: 488 Ohm
MDC IDC SET LEADCHNL RV PACING AMPLITUDE: 2.5 V
MDC IDC SET LEADCHNL RV PACING PULSEWIDTH: 0.5 ms
MDC IDC SET LEADCHNL RV SENSING SENSITIVITY: 2.1 mV
MDC IDC STAT BRADY AP VP PERCENT: 11.89 %
MDC IDC STAT BRADY AS VP PERCENT: 10.96 %
MDC IDC STAT BRADY AS VS PERCENT: 71.37 %
Zone Setting Detection Interval: 400 ms
Zone Setting Detection Interval: 450 ms

## 2015-03-05 LAB — POCT INR: INR: 3.7

## 2015-03-05 MED ORDER — AMIODARONE HCL 200 MG PO TABS: 200.0000 mg | ORAL_TABLET | Freq: Every day | ORAL | Status: DC

## 2015-03-05 NOTE — Patient Instructions (Signed)
Medication Instructions:  Your physician has recommended you make the following change in your medication:  1) Decrease Amiodarone to  daily   Labwork: None ordered  Testing/Procedures: None ordered  Follow-Up: Your physician wants you to follow-up in: 12 months with Cheryl Balsam, NP You will receive a reminder letter in the mail two months in advance. If you don't receive a letter, please call our office to schedule the follow-up appointment.  Remote monitoring is used to monitor your Pacemaker  from home. This monitoring reduces the number of office visits required to check your device to one time per year. It allows [place] to keep an eye on the functioning of your device to ensure it is working properly. You are scheduled for a device check from home on 06/03/18. You may send your transmission at any time that day. If you have a wireless device, the transmission will be sent automatically. After your physician reviews your transmission, you will receive a postcard with your next transmission date.    Any Other Special Instructions Will Be Listed Below (If Applicable).

## 2015-03-05 NOTE — Progress Notes (Signed)
[name], WILLIAM, MD: Primary Cardiologist:  Dr Cheryl [name] isJohny Blame[place] with a h/o sinus bradycardia sp PPM (MDT) implanted in [place] in 2013 who presents today for follow-up in the Electrophysiology device clinic.  She has had longstanding fatigue and palpitations even with sinus rhythm.  She is now in afib all of the time.  Today, she  denies symptoms of dizziness, presyncope, syncope, or neurologic sequela.  The patientis tolerating medications without difficulties and is otherwise without complaint today.   Past Medical History  Diagnosis Date  . Persistent atrial fibrillation        . Hyperlipidemia   . Hypertension   . Sick sinus syndrome     a. s/p MDT dual chamber PPM  . CKD (chronic kidney disease), stage III   . Anemia   . Osteoarthritis        . Chronic right hip pain    Past Surgical History  Procedure Laterality Date  . Tonsillectomy    . Abdominal hysterectomy    . Appendectomy    . Cardioversion N/A 04/04/2013    Procedure: CARDIOVERSION;  Surgeon: Donato Schultz, MD;  Location: [facility]COPY;  Service: Cardiovascular;  Laterality: N/A;  . Total knee arthroplasty Right 09/19/2013  . Hernia repair      "navel"  . Umbilical hernia repair      "I don't have a belly button"  . Cataract extraction w/ intraocular lens  implant, bilateral Bilateral   . Cardioversion  03/2010    /hx medical noted above  . Dilation and curettage of uterus      "had 3 miscarriages"   . Total knee arthroplasty Right 09/19/2013    Procedure: TOTAL KNEE ARTHROPLASTY;  Surgeon: Nestor Lewandowsky, MD;  Location: [facility] OR;  Service: Orthopedics;  Laterality: Right;  . Pacemaker insertion  03/29/12    MDT Revo MRI compatable PPM implanted in [place] for SSS  . Cardioversion N/A 11/06/2014    Procedure: CARDIOVERSION;  Surgeon: Thurmon Fair, MD;  Location: [facility] ENDOSCOPY;  Service: Cardiovascular;  Laterality: N/A;    Social History   Social History  . Marital Status:  Married    Spouse Name: N/A  . Number of Children: N/A  . Years of Education: N/A   Occupational History  . Not on file.   Social History Main Topics  . Smoking status: Former Smoker -- 0.25 packs/day for 40 years    Types: Cigarettes    Quit date: 06/15/1994  . Smokeless tobacco: Never Used  . Alcohol Use: No  . Drug Use: No  . Sexual Activity: No   Other Topics Concern  . Not on file   Social History Narrative    Family History  Problem Relation Age of Onset  . Stroke Mother   . Heart Problems Father   . Hypertension Mother     family history  . Diabetes      family history  . CAD Father   . Hypertension Father   . Diabetes Maternal Aunt     Allergies  Allergen Reactions  . Demerol [Meperidine] Nausea And Vomiting    Current Outpatient Prescriptions  Medication Sig Dispense Refill  . acetaminophen (TYLENOL) 500 MG tablet Take 1,000 mg by mouth 2 (two) times daily.     Marland Kitchen amiodarone (PACERONE) 200 MG tablet Take 1 tablet (200 mg total) by mouth 2 (two) times daily. 180 tablet 3  . atorvastatin (LIPITOR) 20 MG tablet Take 1 tablet (20  mg total) by mouth daily. 90 tablet 3  . furosemide (LASIX) 40 MG tablet Take 1 tablet (40 mg total) by mouth 2 (two) times daily. 180 tablet 3  . potassium chloride SA (K-DUR,KLOR-CON) 20 MEQ tablet Take 1 tablet (20 mEq total) by mouth daily. 90 tablet 3  . warfarin (COUMADIN) 2.5 MG tablet Take as directed by coumadin clinic 30 tablet 3   No current facility-administered medications for this visit.    ROS- all systems are reviewed and negative except as per HPI  Physical Exam: Filed Vitals:   03/05/15 1100  BP: 120/88  Pulse: 92  Height:  (1.676 m)  Weight: 190 lb 12.8 oz (86.546 kg)    GEN- The patient is elderly appearing, alert and oriented x 3 today.   Head- normocephalic, atraumatic Eyes-  Sclera clear, conjunctiva pink Ears- hearing intact Oropharynx- clear Neck- supple,  Lungs- Clear to ausculation  bilaterally, normal work of breathing Chest- pacemaker pocket is well healed Heart- irregular rate and rhythm, 2/6 SEM LUSB (mid peaking) GI- soft, NT, ND, + BS Extremities- no clubbing, cyanosis, + edema MS- age appropriate atrophy Skin- no rash or lesion Psych- euthymic mood, full affect Neuro- strength and sensation are intact  ekg today reveals atrial fibrillation, average V rate 92 bpm Pacemaker interrogation- reviewed in detail today,  See PACEART report  Assessment and Plan:  1. Sick sinus syndrome Normal pacemaker function See Arita Miss Art report Failed attempts to pace terminate atrial fibrillation are noted,  Atrial therapies are therefore turned off today  2. afib She is in afib all of the time.  Cardioversion 6/16 did not hold.  Dr Anne Fu has left on amiodarone  BID with plans to cardiovert at this time. I had a long discussion with the patient today.  After waffling quite a bit, she has finally decided to continue rate control and says that she is not interested in repeat cardioversion. I will reduce amiodarone to  daily at this time.  Consider stopping amiodarone and switching to diltiazem upon follow-up with Dr Anne Fu chads2vasc score is at least 4.  She should continue coumadin  3. Hypertensive cardiovascular disease Stable No change required today   carelink Follow-up with Dr Anne Fu as scheduled Return to see EP NP in 1 year  Hillis Range MD, [facility] 03/05/2015 11:33 AM

## 2015-03-19 ENCOUNTER — Ambulatory Visit (INDEPENDENT_AMBULATORY_CARE_PROVIDER_SITE_OTHER): Payer: Medicare Other | Admitting: *Deleted

## 2015-03-19 DIAGNOSIS — I4891 Unspecified atrial fibrillation: Secondary | ICD-10-CM

## 2015-03-19 DIAGNOSIS — Z5181 Encounter for therapeutic drug level monitoring: Secondary | ICD-10-CM

## 2015-03-19 LAB — POCT INR: INR: 2.9

## 2015-04-02 ENCOUNTER — Encounter: Payer: Self-pay | Admitting: Internal Medicine

## 2015-04-03 ENCOUNTER — Ambulatory Visit (INDEPENDENT_AMBULATORY_CARE_PROVIDER_SITE_OTHER): Payer: Medicare Other | Admitting: Pharmacist

## 2015-04-03 DIAGNOSIS — Z5181 Encounter for therapeutic drug level monitoring: Secondary | ICD-10-CM | POA: Diagnosis not present

## 2015-04-03 DIAGNOSIS — I4891 Unspecified atrial fibrillation: Secondary | ICD-10-CM | POA: Diagnosis not present

## 2015-04-03 LAB — POCT INR: INR: 2.5

## 2015-04-23 ENCOUNTER — Ambulatory Visit (INDEPENDENT_AMBULATORY_CARE_PROVIDER_SITE_OTHER): Payer: Medicare Other | Admitting: Cardiology

## 2015-04-23 ENCOUNTER — Encounter: Payer: Self-pay | Admitting: Cardiology

## 2015-04-23 ENCOUNTER — Ambulatory Visit (INDEPENDENT_AMBULATORY_CARE_PROVIDER_SITE_OTHER): Payer: Medicare Other | Admitting: *Deleted

## 2015-04-23 VITALS — BP 124/86 | HR 87 | Ht 65.5 in | Wt 192.0 lb

## 2015-04-23 DIAGNOSIS — I4819 Other persistent atrial fibrillation: Secondary | ICD-10-CM

## 2015-04-23 DIAGNOSIS — I4892 Unspecified atrial flutter: Secondary | ICD-10-CM

## 2015-04-23 DIAGNOSIS — I481 Persistent atrial fibrillation: Secondary | ICD-10-CM | POA: Diagnosis not present

## 2015-04-23 DIAGNOSIS — I4891 Unspecified atrial fibrillation: Secondary | ICD-10-CM

## 2015-04-23 DIAGNOSIS — Z7901 Long term (current) use of anticoagulants: Secondary | ICD-10-CM

## 2015-04-23 DIAGNOSIS — Z950 Presence of cardiac pacemaker: Secondary | ICD-10-CM

## 2015-04-23 DIAGNOSIS — Z5181 Encounter for therapeutic drug level monitoring: Secondary | ICD-10-CM

## 2015-04-23 DIAGNOSIS — I495 Sick sinus syndrome: Secondary | ICD-10-CM

## 2015-04-23 LAB — POCT INR: INR: 2.1

## 2015-04-23 MED ORDER — DILTIAZEM HCL ER COATED BEADS 120 MG PO CP24: 120.0000 mg | ORAL_CAPSULE | Freq: Every day | ORAL | Status: DC

## 2015-04-23 NOTE — Patient Instructions (Addendum)
Medication Instructions:  Please stop your Amiodarone and start Diltiazem 120 mg once a day. Continue all other medications as listed.  Follow-Up: Follow up in 6 months with Dr. Anne FuSkains.  You will receive a letter in the mail 2 months before you are due.  Please call us when you receive this letter to schedule your follow up appointment.  If you need a refill on your cardiac medications before your next appointment, please call your pharmacy.  Thank you for choosing [facility]!!

## 2015-04-23 NOTE — Progress Notes (Signed)
[address] [place], [place]  [address] Phone: [phone]) [phone] Fax:  [phone]  Date:  04/23/2015   ID:  Cheryl [name], DOB 05/08/1927, MRN [id number]  PCP:  Johny BlamerHARRIS, WILLIAM, MD   History of Present Illness: Cheryl StandingMartha J Dovel is a 81 y.o. female with atrial fibrillation/flutter on chronic anticoagulation, pacemaker, hypertension, hyperlipidemia, use here for followup.  She has a [employer] pacemaker implanted in October of 2013. Moved to the area in 2014. Prior myocardial infarction, prior stroke. LDL 91 on 10/21/12. Hemoglobin 12.9. TSH normal. BNP 787 July of 2014. Prior cardioversion October of 2011, 2014, 11/2014-did not hold.. EKG previously had shown an ectopic atrial rhythm with rate of 55.  After prior cardioversion went back into fib.   She saw Dr. Johney FrameAllred on 02/27/14 and was complaining of sensation of racing heartbeat. Her pacemaker interrogation showed normal function. No tachycardia.   She saw Dr. Johney FrameAllred on 03/05/15-at that point, amiodarone was decreased and today on 04/23/15 we will stop. She at times feel some fatigue.    Wt Readings from Last 3 Encounters:  04/23/15 192 lb (87.091 kg)  03/05/15 190 lb 12.8 oz (86.546 kg)  12/25/14 201 lb 3.2 oz (91.264 kg)     Past Medical History  Diagnosis Date  . Persistent atrial fibrillation (HCC)        . Hyperlipidemia   . Hypertension   . Sick sinus syndrome (HCC)     a. s/p MDT dual chamber PPM  . CKD (chronic kidney disease), stage III   . Anemia   . Osteoarthritis        . Chronic right hip pain     Past Surgical History  Procedure Laterality Date  . Tonsillectomy    . Abdominal hysterectomy    . Appendectomy    . Cardioversion N/A 04/04/2013    Procedure: CARDIOVERSION;  Surgeon: Donato SchultzMark Tehila Sokolow, MD;  [facility];  Service: Cardiovascular;  Laterality: N/A;  . Total knee arthroplasty Right 09/19/2013  . Hernia repair      "navel"  . Umbilical hernia repair      "I don't have a  belly button"  . Cataract extraction w/ intraocular lens  implant, bilateral Bilateral   . Cardioversion  03/2010    /hx medical noted above  . Dilation and curettage of uterus      "had 3 miscarriages"   . Total knee arthroplasty Right 09/19/2013    Procedure: TOTAL KNEE ARTHROPLASTY;  Surgeon: Nestor LewandowskyFrank J Rowan, MD;  Location: [facility] OR;  Service: Orthopedics;  Laterality: Right;  . Pacemaker insertion  03/29/12    MDT Revo MRI compatable PPM implanted in [place] for SSS  . Cardioversion N/A 11/06/2014    Procedure: CARDIOVERSION;  Surgeon: Thurmon FairMihai Croitoru, MD;  Location: [facility] ENDOSCOPY;  Service: Cardiovascular;  Laterality: N/A;    Current Outpatient Prescriptions  Medication Sig Dispense Refill  . acetaminophen (TYLENOL) 500 MG tablet Take 1,000 mg by mouth 2 (two) times daily.     Marland Kitchen. amiodarone (PACERONE) 200 MG tablet Take 1 tablet (200 mg total) by mouth daily. 90 tablet 3  . atorvastatin (LIPITOR) 20 MG tablet Take 1 tablet (20 mg total) by mouth daily. 90 tablet 3  . clotrimazole (LOTRIMIN) 1 % cream Apply topically.    . furosemide (LASIX) 40 MG tablet Take 1 tablet (40 mg total) by mouth 2 (two) times daily. 180 tablet 3  . potassium chloride SA (K-DUR,KLOR-CON) 20 MEQ tablet Take 1  tablet (20 mEq total) by mouth daily. 90 tablet 3  . warfarin (COUMADIN) 2.5 MG tablet Take as directed by coumadin clinic 30 tablet 3   No current facility-administered medications for this visit.    Allergies:    Allergies  Allergen Reactions  . Demerol [Meperidine] Nausea And Vomiting    Social History:  The patient  reports that she quit smoking about 20 years ago. Her smoking use included Cigarettes. She has a 10 pack-year smoking history. She has never used smokeless tobacco. She reports that she does not drink alcohol or use illicit drugs.   ROS:  Please see the history of present illness.  Positive for leg swelling, shortness of breath when laying down, shortness of breath with activity, back  pain, muscle pain, easy bruising, joint swelling, balance and walking issues, anxiety, irregular heartbeats, excessive fatigue, chest pressure  PHYSICAL EXAM: VS:  BP 124/86 mmHg  Pulse 87  Ht 5' 5.5" (1.664 m)  Wt 192 lb (87.091 kg)  BMI 31.45 kg/m2  SpO2 98% Well nourished, well developed, in no acute distress HEENT: normal Neck: no JVD Cardiac:  Irregular normal rate; 3/6 SEM murmur radiation to carotids.  Lungs:  clear to auscultation bilaterally, no wheezing, rhonchi or rales Abd: soft, nontender, no hepatomegaly Ext: no LE edemaright knee effusion Skin: warm and dry Neuro: no focal abnormalities noted  EKG:  None today. Prior cardioversion.    Echocardiogram: 2015  - Left ventricle: The cavity size was normal. Wall thickness was increased in a pattern of mild LVH. Systolic function was normal. The estimated ejection fraction was in the range of 60% to 65%. Wall motion was normal; there were no regional wall motion abnormalities. - Aortic valve: Valve mobility was moderately restricted. There was moderate stenosis. There was mild regurgitation. - Mitral valve: There was mild regurgitation. - Left atrium: The atrium was moderately dilated.  ASSESSMENT AND PLAN:  1. Atrial flutter/fibrillation-chronic, persistent. She has failed multiple cardioversions even on amiodarone. With coordinated efforts with EP clinic, we will go ahead and discontinue amiodarone at this time and replace with diltiazem CD 120 mg once a day or continued rate control.  2. Chronic anticoagulation-Well monitored. No strokes, no bleeding  3. Pacemaker - prior notes reviewed. Appreciate Dr. Jenel Lucks assistant  4. Hyperlipidemia-continue with low-dose atorvastatin 20 mg.  Signed, Donato Schultz, MD [facility]  04/23/2015 1:35 PM

## 2015-05-21 ENCOUNTER — Ambulatory Visit (INDEPENDENT_AMBULATORY_CARE_PROVIDER_SITE_OTHER): Payer: Medicare Other | Admitting: *Deleted

## 2015-05-21 DIAGNOSIS — Z5181 Encounter for therapeutic drug level monitoring: Secondary | ICD-10-CM

## 2015-05-21 DIAGNOSIS — I4891 Unspecified atrial fibrillation: Secondary | ICD-10-CM | POA: Diagnosis not present

## 2015-05-21 LAB — POCT INR: INR: 2.2

## 2015-06-04 ENCOUNTER — Ambulatory Visit (INDEPENDENT_AMBULATORY_CARE_PROVIDER_SITE_OTHER): Payer: Medicare Other | Admitting: *Deleted

## 2015-06-04 ENCOUNTER — Telehealth: Payer: Self-pay | Admitting: Cardiology

## 2015-06-04 DIAGNOSIS — I495 Sick sinus syndrome: Secondary | ICD-10-CM | POA: Diagnosis not present

## 2015-06-04 NOTE — Telephone Encounter (Signed)
LMOVM reminding pt to send remote transmission.   

## 2015-06-05 ENCOUNTER — Encounter: Payer: Self-pay | Admitting: Cardiology

## 2015-06-05 NOTE — Progress Notes (Signed)
Remote pacemaker transmission.   

## 2015-06-06 LAB — CUP PACEART REMOTE DEVICE CHECK
Battery Voltage: 2.97 V
Brady Statistic AP VP Percent: 4 %
Brady Statistic AP VS Percent: 0.97 %
Brady Statistic AS VP Percent: 15.39 %
Brady Statistic AS VS Percent: 79.64 %
Brady Statistic RA Percent Paced: 4.98 %
Implantable Lead Implant Date: 20131014
Implantable Lead Implant Date: 20131014
Implantable Lead Location: 753859
Lead Channel Impedance Value: 448 Ohm
Lead Channel Impedance Value: 504 Ohm
Lead Channel Setting Pacing Amplitude: 2 V
Lead Channel Setting Pacing Pulse Width: 0.5 ms
MDC IDC LEAD LOCATION: 753860
MDC IDC LEAD MODEL: 5086
MDC IDC LEAD MODEL: 5086
MDC IDC MSMT LEADCHNL RA SENSING INTR AMPL: 0.348 mV
MDC IDC SESS DTM: 20161220145334
MDC IDC SET LEADCHNL RV PACING AMPLITUDE: 2.5 V
MDC IDC SET LEADCHNL RV SENSING SENSITIVITY: 2.1 mV
MDC IDC STAT BRADY RV PERCENT PACED: 19.39 %

## 2015-06-07 ENCOUNTER — Encounter: Payer: Self-pay | Admitting: Cardiology

## 2015-06-25 ENCOUNTER — Telehealth: Payer: Self-pay | Admitting: Cardiology

## 2015-06-25 MED ORDER — POTASSIUM CHLORIDE CRYS ER 20 MEQ PO TBCR: 20.0000 meq | EXTENDED_RELEASE_TABLET | Freq: Every day | ORAL | Status: AC

## 2015-06-25 MED ORDER — ATORVASTATIN CALCIUM 20 MG PO TABS: 20.0000 mg | ORAL_TABLET | Freq: Every day | ORAL | Status: AC

## 2015-06-25 MED ORDER — DILTIAZEM HCL ER COATED BEADS 120 MG PO CP24: 120.0000 mg | ORAL_CAPSULE | Freq: Every day | ORAL | Status: AC

## 2015-06-25 MED ORDER — FUROSEMIDE 40 MG PO TABS: 40.0000 mg | ORAL_TABLET | Freq: Two times a day (BID) | ORAL | Status: AC

## 2015-06-25 MED ORDER — WARFARIN SODIUM 2.5 MG PO TABS: ORAL_TABLET | ORAL | Status: AC

## 2015-06-25 NOTE — Telephone Encounter (Signed)
New Message  Pt dtr calling to speak w/ RN concerning a hard copy of one of pt's RX- to be sent to new residence in [place]. Please call back and discuss.

## 2015-06-25 NOTE — Telephone Encounter (Signed)
Per daughter [facility] [place] [address], [place], [place] [id number] phone #  [phone].  They need a written RX sent to facility.  Aware Dr Anne FuSkains will refill for 1 month supply with 1 refill only. This has been completed and the RXs placed in the mail.

## 2015-09-03 ENCOUNTER — Encounter: Payer: Medicare Other | Admitting: *Deleted

## 2015-09-07 ENCOUNTER — Encounter: Payer: Self-pay | Admitting: Cardiology

## 2016-11-14 DEATH — deceased
